# Patient Record
Sex: Male | Born: 1950
Health system: Southern US, Community
[De-identification: ages and names within clinical notes are randomized; demographics above are authoritative.]

## PROBLEM LIST (undated history)

## (undated) DIAGNOSIS — M1990 Unspecified osteoarthritis, unspecified site: Secondary | ICD-10-CM

## (undated) DIAGNOSIS — I10 Essential (primary) hypertension: Secondary | ICD-10-CM

## (undated) DIAGNOSIS — M549 Dorsalgia, unspecified: Secondary | ICD-10-CM

## (undated) DIAGNOSIS — G8929 Other chronic pain: Secondary | ICD-10-CM

## (undated) DIAGNOSIS — E7800 Pure hypercholesterolemia, unspecified: Secondary | ICD-10-CM

## (undated) DIAGNOSIS — Z85528 Personal history of other malignant neoplasm of kidney: Secondary | ICD-10-CM

## (undated) DIAGNOSIS — K219 Gastro-esophageal reflux disease without esophagitis: Secondary | ICD-10-CM

## (undated) DIAGNOSIS — I776 Arteritis, unspecified: Secondary | ICD-10-CM

## (undated) HISTORY — PX: COSMETIC SURGERY: SHX468

## (undated) HISTORY — PX: FRACTURE SURGERY: SHX138

## (undated) HISTORY — DX: Gastro-esophageal reflux disease without esophagitis: K21.9

## (undated) HISTORY — PX: SHOULDER SURGERY: SHX246

## (undated) HISTORY — PX: COLONOSCOPY: SHX174

## (undated) HISTORY — PX: VASECTOMY: SHX75

## (undated) HISTORY — DX: Dorsalgia, unspecified: M54.9

## (undated) HISTORY — DX: Arteritis, unspecified: I77.6

## (undated) HISTORY — DX: Personal history of other malignant neoplasm of kidney: Z85.528

## (undated) HISTORY — PX: ELBOW SURGERY: SHX618

## (undated) HISTORY — DX: Other chronic pain: G89.29

## (undated) HISTORY — DX: Unspecified osteoarthritis, unspecified site: M19.90

---

## 2008-10-31 ENCOUNTER — Ambulatory Visit: Payer: Self-pay | Admitting: Gastroenterology

## 2008-11-12 ENCOUNTER — Telehealth: Payer: Self-pay | Admitting: Internal Medicine

## 2008-11-15 ENCOUNTER — Ambulatory Visit: Payer: Self-pay | Admitting: Gastroenterology

## 2011-08-23 ENCOUNTER — Emergency Department (INDEPENDENT_AMBULATORY_CARE_PROVIDER_SITE_OTHER)
Admission: EM | Admit: 2011-08-23 | Discharge: 2011-08-23 | Disposition: A | Discharge status: Home / Self Care | Payer: Self-pay | Source: Home / Self Care | Attending: Family Medicine | Admitting: Family Medicine

## 2011-08-23 ENCOUNTER — Encounter (HOSPITAL_COMMUNITY): Payer: Self-pay

## 2011-08-23 DIAGNOSIS — L309 Dermatitis, unspecified: Secondary | ICD-10-CM

## 2011-08-23 DIAGNOSIS — L259 Unspecified contact dermatitis, unspecified cause: Secondary | ICD-10-CM

## 2011-08-23 HISTORY — DX: Essential (primary) hypertension: I10

## 2011-08-23 HISTORY — DX: Pure hypercholesterolemia, unspecified: E78.00

## 2011-08-23 MED ORDER — HYDROXYZINE HCL 25 MG PO TABS: 25.0000 mg | ORAL_TABLET | Freq: Four times a day (QID) | ORAL | Status: AC

## 2011-08-23 MED ORDER — PREDNISONE 5 MG PO TABS: ORAL_TABLET | ORAL | Status: AC

## 2011-08-23 MED ORDER — PREDNISONE 20 MG PO TABS: ORAL_TABLET | ORAL | Status: AC

## 2011-08-23 NOTE — ED Notes (Signed)
Pt has itchy rash on face, neck and trunk that started on Thursday, no new meds and states he had prednisone that he took from previous visit.

## 2011-08-23 NOTE — ED Provider Notes (Signed)
History     CSN: [id number]  Arrival date & time 08/23/11  0908   First MD Initiated Contact with Patient 08/23/11 [phone]      Chief Complaint  Patient presents with  . Rash    (Consider location/radiation/quality/duration/timing/severity/associated sxs/prior treatment) HPI Comments: Johnny Bryant presents for evaluation of an itchy rash that started on his face and neck and now has extended to his trunk and legs and arms. He denies any new exposures except for bar of soap a used Wednesday night. He also reports walking dogs outside near a shelter. He denies any new medications. He does take Aquanil lisinopril and pravastatin. He did start a prednisone Dosepak that he had been given for arthritis. He thinks the rash is worsening despite this. He denies any shortness of breath or swelling. Patient is a 61 y.o. male presenting with rash. The history is provided by the patient.  Rash  This is a new problem. The problem has been gradually worsening. The problem is associated with an unknown factor. There has been no fever. The rash is present on the scalp, head, face, neck, torso, back, abdomen, trunk, left upper leg and right upper leg. The patient is experiencing no pain. The pain has been constant since onset. Associated symptoms include itching. He has tried antihistamines for the symptoms.    Past Medical History  Diagnosis Date  . Hypertension   . Hypercholesteremia     Past Surgical History  Procedure Date  . Elbow surgery   . Shoulder surgery     History reviewed. No pertinent family history.  History  Substance Use Topics  . Smoking status: Never Smoker   . Smokeless tobacco: Not on file  . Alcohol Use: Yes      Review of Systems  Constitutional: Negative.   HENT: Negative.   Eyes: Negative.   Respiratory: Negative.   Cardiovascular: Negative.   Gastrointestinal: Negative.   Genitourinary: Negative.   Musculoskeletal: Negative.   Skin: Positive for itching and rash.    Neurological: Negative.     Allergies  Penicillins  Home Medications   Current Outpatient Rx  Name Route Sig Dispense Refill  . HYDROXYCHLOROQUINE SULFATE 200 MG PO TABS Oral Take by mouth daily.    Marland Kitchen LISINOPRIL 20 MG PO TABS Oral Take 20 mg by mouth daily.    Marland Kitchen PRAVASTATIN SODIUM 20 MG PO TABS Oral Take 20 mg by mouth daily.    Marland Kitchen HYDROXYZINE HCL 25 MG PO TABS Oral Take 1 tablet (25 mg total) by mouth every 6 (six) hours. 12 tablet 0  . PREDNISONE 20 MG PO TABS  Take three tablets daily on days 1 - 3, then two tablets daily on days 4 - 6. Then take 1 tablet daily on days 7 - 9. Then start next set of tablets. 18 tablet 0  . PREDNISONE 5 MG PO TABS  Take two tablets daily on days 10 - 12, then take one tablet on days 13 - 15 9 tablet 0    BP 143/74  Pulse 92  Temp(Src) 98.7 F (37.1 C) (Oral)  Resp 18  SpO2 97%  Physical Exam  Nursing note and vitals reviewed. Constitutional: He is oriented to person, place, and time. He appears well-developed and well-nourished.  HENT:  Head: Normocephalic and atraumatic.  Right Ear: Tympanic membrane normal.  Left Ear: Tympanic membrane normal.  Mouth/Throat: Uvula is midline, oropharynx is clear and moist and mucous membranes are normal.  Eyes: EOM are normal.  Neck:  Normal range of motion.  Pulmonary/Chest: Effort normal and breath sounds normal. He has no wheezes. He has no rhonchi.  Musculoskeletal: Normal range of motion.  Neurological: He is alert and oriented to person, place, and time.  Skin: Skin is warm and dry. Rash noted. Rash is macular. There is erythema.     Psychiatric: His behavior is normal.    ED Course  Procedures (including critical care time)  Labs Reviewed - No data to display No results found.   1. Dermatitis       MDM  Extended prednisone taper given; rx for hydroxyzine given; advised use of topical preparations for symptom relief; dermatology info given if symptoms worsen        Richardo Priest, MD 08/23/11 1108

## 2011-08-28 ENCOUNTER — Telehealth (HOSPITAL_COMMUNITY): Payer: Self-pay | Admitting: *Deleted

## 2011-08-28 ENCOUNTER — Emergency Department (INDEPENDENT_AMBULATORY_CARE_PROVIDER_SITE_OTHER)
Admission: EM | Admit: 2011-08-28 | Discharge: 2011-08-28 | Disposition: A | Discharge status: Home / Self Care | Payer: Self-pay | Source: Home / Self Care | Attending: Emergency Medicine | Admitting: Emergency Medicine

## 2011-08-28 ENCOUNTER — Encounter (HOSPITAL_COMMUNITY): Payer: Self-pay | Admitting: *Deleted

## 2011-08-28 DIAGNOSIS — I10 Essential (primary) hypertension: Secondary | ICD-10-CM

## 2011-08-28 DIAGNOSIS — L270 Generalized skin eruption due to drugs and medicaments taken internally: Secondary | ICD-10-CM

## 2011-08-28 DIAGNOSIS — T887XXA Unspecified adverse effect of drug or medicament, initial encounter: Secondary | ICD-10-CM

## 2011-08-28 DIAGNOSIS — T50904A Poisoning by unspecified drugs, medicaments and biological substances, undetermined, initial encounter: Secondary | ICD-10-CM

## 2011-08-28 MED ORDER — METHYLPREDNISOLONE ACETATE PF 80 MG/ML IJ SUSP
80.0000 mg | Freq: Once | INTRAMUSCULAR | Status: AC
  Administered 2011-08-28: 80 mg via INTRAMUSCULAR

## 2011-08-28 MED ORDER — METHYLPREDNISOLONE ACETATE 80 MG/ML IJ SUSP
INTRAMUSCULAR | Status: AC
  Filled 2011-08-28: qty 1

## 2011-08-28 MED ORDER — NYSTATIN 100000 UNIT/ML MT SUSP: 500000.0000 [IU] | Freq: Four times a day (QID) | OROMUCOSAL | Status: AC

## 2011-08-28 MED ORDER — DOXEPIN HCL 10 MG PO CAPS: 10.0000 mg | ORAL_CAPSULE | Freq: Three times a day (TID) | ORAL | Status: DC

## 2011-08-28 NOTE — ED Notes (Signed)
Johnny Bryant  Reports       Rash    With  Burning  Sensation   X  1  week  [facility]  [facility]     And  Was  Given rx  For  Prednisone   Which  He  Is  Almost  Finished     He  denys  Any new  meds  Or       Known  Causative  Agents

## 2011-08-28 NOTE — ED Provider Notes (Signed)
Chief Complaint  Patient presents with  . Rash    History of Present Illness:   Johnny Bryant has a ten-day history of a pruritic, painful rash which began on his neck and spread down his chest, upper back, abdomen, and then to his arms and legs. The rash on the neck and upper chest seems to be getting better, but the rash on the arms and legs getting worse. He came here about a week ago. He was given prednisone and he thinks this has helped somewhat. The rash itches and burns. The only new medication is taking his hydroxychloroquine which he is taking for rheumatoid arthritis. He denies any fever but he's had chills and sweats. He's had a somewhat scratchy throat and his voice has been hoarse. He has had no suspicious exposures, contactants, other or new medications or ingestions, or use of new soaps, washing powders, fabric softeners, or dryer sheets.  Review of Systems:  Other than noted above, the patient denies any of the following symptoms: Systemic:  No fever, chills, sweats, weight loss, or fatigue. ENT:  No nasal congestion, rhinorrhea, sore throat, swelling of lips, tongue or throat. Resp:  No cough, wheezing, or shortness of breath. Skin:  No rash, itching, nodules, or suspicious lesions.  PMFSH:  Past medical history, family history, social history, meds, and allergies were reviewed.  Physical Exam:   Vital signs:  BP 159/77  Pulse 104  Temp(Src) 98.4 F (36.9 C) (Oral)  Resp 18  SpO2 100% Gen:  Alert, oriented, in no distress. Skin:  He has an erythematous rash on his trunk, arms, and legs. The rash on the neck is almost gone away completely in the rash the trunk is fading, but the rash in the arms and legs is worse. This is not peeling. It's erythematous and blanches. There is no purpura or petechiae iris or bull's-eye lesions. Her. The rash is very faint and lacey. ENT: He has white patches on his buccal mucosa, soft palate, and pharynx consistent with a Candida infection.  Course in  [facility]gent [facility]:   He was given Depo-Medrol 80 mg IM and tolerated this medication well without any immediate side effects.   Assessment:   Diagnoses that have been ruled out:  None  Diagnoses that are still under consideration:  None  Final diagnoses:  Allergic drug rash - most likely due to allergy, possibly to hydroxychloroquine. Alternatively, this may be due to to a viral illness.     Plan:   1.  The following meds were prescribed:   New Prescriptions   DOXEPIN (SINEQUAN) 10 MG CAPSULE    Take 1 capsule (10 mg total) by mouth 3 (three) times daily.   NYSTATIN (MYCOSTATIN) 100000 UNIT/ML SUSPENSION    Take 5 mLs (500,000 Units total) by mouth 4 (four) times daily.   2.  The patient was instructed in symptomatic care and handouts were given. 3.  The patient was told to return if becoming worse in any way, if no better in 3 or 4 days, and given some red flag symptoms that would indicate earlier return. 4.  The patient was told to discontinue his hydroxychloroquine.  Once the rash is gone away, he can try rechallenging himself, just to make sure that's what is.     Roque Lias, MD 08/28/11 [phone]

## 2011-08-28 NOTE — ED Notes (Signed)
1343 Pt. called and said the doctor did not give him the steroid shot last time because he was driving. He is taking the pills but the hives have continued. Denies SOB. Does not know what he is allergic to. No PCP or insurance. He asked if he gets a driver can he get the shot. I told him I don't know what the doctor will prescribe but should be rechecked if not getting better. He asked if it would be better to come later. I told him it would be better to come now because it gets busier after 1700. Vassie Moselle 08/28/2011

## 2011-08-28 NOTE — Discharge Instructions (Signed)
Stop hydroxycholoroquine.

## 2011-09-03 ENCOUNTER — Telehealth (HOSPITAL_COMMUNITY): Payer: Self-pay | Admitting: *Deleted

## 2011-09-03 NOTE — ED Notes (Signed)
Pt. called on VM and said he was treated 2 times for his rash and symptoms are coming back. Please call today-going out of town. I called pt. back and he said he started to get itching and rash on his legs again yesterday and is leaving for the beach. States he will be gone for a week. I told him the doctor is not going to prescribe steroids without seeing him again. I told him he could go to an [facility] at the beach.  He said he can't afford to keep coming in for this. He has no insurance or PCP. I told he has not choice but to be seen again and try to figure out what is causing the rash. Stop medicine and start back on at a time to see if he can figure out if that is what he is allergic to. Vassie Moselle 09/03/2011

## 2012-10-07 ENCOUNTER — Emergency Department (INDEPENDENT_AMBULATORY_CARE_PROVIDER_SITE_OTHER)
Admission: EM | Admit: 2012-10-07 | Discharge: 2012-10-07 | Disposition: A | Discharge status: Home / Self Care | Payer: Self-pay | Source: Home / Self Care | Attending: Emergency Medicine | Admitting: Emergency Medicine

## 2012-10-07 ENCOUNTER — Emergency Department (INDEPENDENT_AMBULATORY_CARE_PROVIDER_SITE_OTHER): Payer: Self-pay

## 2012-10-07 ENCOUNTER — Encounter (HOSPITAL_COMMUNITY): Payer: Self-pay | Admitting: *Deleted

## 2012-10-07 DIAGNOSIS — S2232XA Fracture of one rib, left side, initial encounter for closed fracture: Secondary | ICD-10-CM

## 2012-10-07 DIAGNOSIS — L719 Rosacea, unspecified: Secondary | ICD-10-CM

## 2012-10-07 DIAGNOSIS — S2239XA Fracture of one rib, unspecified side, initial encounter for closed fracture: Secondary | ICD-10-CM

## 2012-10-07 MED ORDER — ACETAMINOPHEN-CODEINE #3 300-30 MG PO TABS: 1.0000 | ORAL_TABLET | ORAL | Status: DC | PRN

## 2012-10-07 MED ORDER — METRONIDAZOLE 1 % EX GEL: Freq: Every day | CUTANEOUS | Status: DC

## 2012-10-07 MED ORDER — DOXYCYCLINE HYCLATE 100 MG PO TABS: 100.0000 mg | ORAL_TABLET | Freq: Two times a day (BID) | ORAL | Status: DC

## 2012-10-07 NOTE — ED Provider Notes (Signed)
Chief Complaint:   Chief Complaint  Patient presents with  . Rash    rash      History of Present Illness:   Johnny Bryant is a 62 year old male with a history of high blood pressure and hyperlipidemia who presents today with a facial rash and chest pain.  1. Facial rash: This appears about 3 weeks ago. He attributed to some soma that he was taking and stop the soma is been off of this for about 2-1/2 weeks but the rash has persisted. It's mildly pruritic he took some prednisone the rash went away completely while he was taking it but then recurred again. He recalls a similar rash although less severe off and on for years.  2. Chest pain: The patient fell on ice 6 weeks ago, landing on his left chest. Ever since then he's had left submammary pain with deep inspiration, twisting, and bending he attributes this pain to AP and in the shoulder that was placed about 4 years ago. He thinks he was told by another doctor that this might have migrated to around his heart and might be causing him that chest pain. He denies any shortness of breath, cough, or hemoptysis.  Review of Systems:  Other than noted above, the patient denies any of the following symptoms. Systemic:  No fever, chills, sweats, fatigue, myalgias, headache, or anorexia. Eye:  No redness, pain or drainage. ENT:  No earache, nasal congestion, rhinorrhea, sinus pressure, or sore throat. Lungs:  No cough, sputum production, wheezing, shortness of breath.  Cardiovascular:  No chest pain, palpitations, or syncope. GI:  No nausea, vomiting, abdominal pain or diarrhea. GU:  No dysuria, frequency, or hematuria. Skin:  No rash or pruritis.  PMFSH:  Past medical history, family history, social history, meds, and allergies were reviewed.  He is allergic to penicillin. He has high blood pressure and hyperlipidemia. He takes Vicodin and amitriptyline as well as a blood pressure and cholesterol medicine. He's been troubled with chronic  indigestion.  Physical Exam:   Vital signs:  BP 200/111  Pulse 105  Temp(Src) 99.1 F (37.3 C) (Oral)  Resp 22  SpO2 100% General:  Alert, in no distress. Eye:  PERRL, full EOMs.  Lids and conjunctivas were normal. ENT:  TMs and canals were normal, without erythema or inflammation.  Nasal mucosa was clear and uncongested, without drainage.  Mucous membranes were moist.  Pharynx was clear, without exudate or drainage.  There were no oral ulcerations or lesions. Neck:  Supple, no adenopathy, tenderness or mass. Thyroid was normal. Lungs:  No respiratory distress.  Lungs were clear to auscultation, without wheezes, rales or rhonchi.  Breath sounds were clear and equal bilaterally. Chest: He has localized tenderness to palpation in the left submammary area without any swelling, bruising, or deformity. Heart:  Regular rhythm, without gallops, murmers or rubs. Abdomen:  Soft, flat, and non-tender to palpation.  No hepatosplenomagaly or mass. Skin:  Clear, warm, and dry, he has erythema of his face involving cheeks, nasal labial folds, and forehead, skin was otherwise clear.  Radiology:  Dg Ribs Unilateral W/chest Left  10/07/2012  *RADIOLOGY REPORT*  Clinical Data: Left sub mammary pain, fell 6 weeks ago  LEFT RIBS AND CHEST - 3+ VIEW  Comparison: None  Findings: Normal heart size, mediastinal contours, and pulmonary vascularity. Lungs clear. No pleural effusion or pneumothorax. Metallic foreign body projects over left axilla. Bones appear slightly demineralized. Healing fractures of the anterior left fifth and sixth ribs identified.  IMPRESSION: Healing fractures anterior left fifth and sixth ribs.   Original Report Authenticated By: Ulyses Southward, M.D.       Assessment:  The primary encounter diagnosis was Rosacea. A diagnosis of Rib fractures, left, closed, initial encounter was also pertinent to this visit.  The facial rash appears to be rosacea. I do not think it's an allergic rash and don't  think a headache into the soma, I think is just coincidence that appears about the same time he started taking soma.  The chest pain appears to be due to fractures of the fifth and sixth ribs anteriorly. He does have pain in the region of the axilla, but there is no evidence that this is causing his chest pain.  Plan:   1.  The following meds were prescribed:   Discharge Medication List as of 10/07/2012  5:49 PM    START taking these medications   Details  acetaminophen-codeine (TYLENOL #3) 300-30 MG per tablet Take 1-2 tablets by mouth every 4 (four) hours as needed for pain., Starting 10/07/2012, Until Discontinued, Print    doxycycline (VIBRA-TABS) 100 MG tablet Take 1 tablet (100 mg total) by mouth 2 (two) times daily., Starting 10/07/2012, Until Discontinued, Normal    metroNIDAZOLE (METROGEL) 1 % gel Apply topically daily., Starting 10/07/2012, Until Discontinued, Normal       2.  The patient was instructed in symptomatic care and handouts were given. 3.  The patient was told to return if becoming worse in any way, if no better in 3 or 4 days, and given some red flag symptoms such as fever, worsening rash, or difficulty breathing that would indicate earlier return.    Reuben Likes, MD 10/07/12 2005

## 2012-10-07 NOTE — ED Notes (Signed)
Pt  Reports  Symptoms   Of  Rash         X  1  Week   After  Taking  Soma       He  Stopped  The  Soma       Because  Of the  Rash but it  Still  Is  There         He  Also  Stopped  His  Lisinopril       And  His  bp is  High      -  He  Also  Reports  Pain l  Side  Of  Chest  Worse  When he  Takes a  Deep  Breath     He  Attributes  This  To a  Fall he  Had  A  Few  Weeks  Ago  And  Previous   Surgery

## 2013-08-08 ENCOUNTER — Ambulatory Visit (INDEPENDENT_AMBULATORY_CARE_PROVIDER_SITE_OTHER): Payer: No Typology Code available for payment source | Admitting: Internal Medicine

## 2013-08-08 VITALS — BP 152/80 | HR 83 | Temp 98.9°F | Resp 18 | Ht 68.0 in | Wt 177.0 lb

## 2013-08-08 DIAGNOSIS — M25529 Pain in unspecified elbow: Secondary | ICD-10-CM

## 2013-08-08 DIAGNOSIS — D649 Anemia, unspecified: Secondary | ICD-10-CM

## 2013-08-08 DIAGNOSIS — K921 Melena: Secondary | ICD-10-CM

## 2013-08-08 DIAGNOSIS — N390 Urinary tract infection, site not specified: Secondary | ICD-10-CM

## 2013-08-08 LAB — COMPREHENSIVE METABOLIC PANEL
ALK PHOS: 74 U/L (ref 39–117)
ALT: 12 U/L (ref 0–53)
AST: 18 U/L (ref 0–37)
Albumin: 4.5 g/dL (ref 3.5–5.2)
BILIRUBIN TOTAL: 0.5 mg/dL (ref 0.2–1.2)
BUN: 12 mg/dL (ref 6–23)
CO2: 27 mEq/L (ref 19–32)
Calcium: 9.8 mg/dL (ref 8.4–10.5)
Chloride: 101 mEq/L (ref 96–112)
Creat: 0.7 mg/dL (ref 0.50–1.35)
GLUCOSE: 121 mg/dL — AB (ref 70–99)
Potassium: 4.2 mEq/L (ref 3.5–5.3)
SODIUM: 138 meq/L (ref 135–145)
TOTAL PROTEIN: 7.3 g/dL (ref 6.0–8.3)

## 2013-08-08 LAB — POCT CBC
Granulocyte percent: 69 %G (ref 37–80)
HCT, POC: 41.3 % — AB (ref 43.5–53.7)
Hemoglobin: 13.4 g/dL — AB (ref 14.1–18.1)
LYMPH, POC: 1.7 (ref 0.6–3.4)
MCH, POC: 30.2 pg (ref 27–31.2)
MCHC: 32.4 g/dL (ref 31.8–35.4)
MCV: 93.1 fL (ref 80–97)
MID (CBC): 0.5 (ref 0–0.9)
MPV: 9.3 fL (ref 0–99.8)
PLATELET COUNT, POC: 279 10*3/uL (ref 142–424)
POC GRANULOCYTE: 5 (ref 2–6.9)
POC LYMPH %: 23.5 % (ref 10–50)
POC MID %: 7.5 % (ref 0–12)
RBC: 4.44 M/uL — AB (ref 4.69–6.13)
RDW, POC: 13.6 %
WBC: 7.3 10*3/uL (ref 4.6–10.2)

## 2013-08-08 LAB — POCT UA - MICROSCOPIC ONLY
CASTS, UR, LPF, POC: NEGATIVE
CRYSTALS, UR, HPF, POC: NEGATIVE
Mucus, UA: NEGATIVE
Yeast, UA: NEGATIVE

## 2013-08-08 LAB — POCT URINALYSIS DIPSTICK
BILIRUBIN UA: NEGATIVE
Glucose, UA: NEGATIVE
Ketones, UA: NEGATIVE
Leukocytes, UA: NEGATIVE
NITRITE UA: NEGATIVE
PH UA: 7
Spec Grav, UA: 1.02
UROBILINOGEN UA: 0.2

## 2013-08-08 LAB — IFOBT (OCCULT BLOOD): IMMUNOLOGICAL FECAL OCCULT BLOOD TEST: POSITIVE

## 2013-08-08 MED ORDER — HYDROCODONE-ACETAMINOPHEN 5-325 MG PO TABS: 1.0000 | ORAL_TABLET | Freq: Four times a day (QID) | ORAL | Status: DC | PRN

## 2013-08-08 MED ORDER — CEFTRIAXONE SODIUM 1 G IJ SOLR
1.0000 g | INTRAMUSCULAR | Status: DC
  Administered 2013-08-08: 1 g via INTRAMUSCULAR

## 2013-08-08 MED ORDER — CIPROFLOXACIN HCL 500 MG PO TABS: 500.0000 mg | ORAL_TABLET | Freq: Two times a day (BID) | ORAL | Status: DC

## 2013-08-08 NOTE — Progress Notes (Signed)
Subjective:    Patient ID: Johnny Bryant, male    DOB: [birth year], 63 y.o.   MRN: [id number]  HPI  63 year old male presents to [facility] for joint pain  Recurring joint pain, presently pain in left shoulder, right elbow, left wrist, Pain typically spreads from left wrist, left shoulder, right shoulder, right elbow, right wrist, left knee Swells, pain worsens until treated with prednisone.  Saw an orthopedic specialist last month and was told he has tendonitis, given prednisone and codeine. Orthopedic specialist stated he needs to see a rheumatologist or another orthopedic locally Just got insurance and needs a primary care physician  Needs referral to Dr Delice Bison, appointment is in March .  Review of Systems Needs cpe soon    Objective:   Physical Exam  Vitals reviewed. Constitutional: He is oriented to person, place, and time. He appears well-developed and well-nourished.  HENT:  Head: Normocephalic.  Mouth/Throat: Oropharynx is clear and moist.  Eyes: EOM are normal. Pupils are equal, round, and reactive to light. No scleral icterus.  Neck: Normal range of motion. Neck supple. No thyromegaly present.  Cardiovascular: Normal rate, regular rhythm and normal heart sounds.   Pulmonary/Chest: Effort normal and breath sounds normal.  Abdominal: Soft.  Genitourinary: Prostate normal. Guaiac positive stool. No penile tenderness.  Musculoskeletal: He exhibits tenderness. He exhibits no edema.       Left shoulder: He exhibits decreased range of motion, tenderness, bony tenderness and pain. He exhibits no swelling, no crepitus and normal strength.       Right elbow: He exhibits normal range of motion, no swelling, no effusion and no deformity. Tenderness found. Radial head and medial epicondyle tenderness noted. No lateral epicondyle and no olecranon process tenderness noted.       Left wrist: He exhibits tenderness and bony tenderness. He exhibits normal range of  motion, no swelling, no effusion, no crepitus and no deformity.       Left knee: He exhibits bony tenderness and abnormal meniscus. He exhibits normal range of motion, no swelling, no effusion, no ecchymosis, no deformity and no erythema. Tenderness found. Medial joint line tenderness noted.  No swelling, reddness, or warmth to any joints  Lymphadenopathy:    He has no cervical adenopathy.  Neurological: He is alert and oriented to person, place, and time. He exhibits normal muscle tone. Coordination normal.  Skin: Skin is dry and intact. No rash noted.  Psychiatric: He has a normal mood and affect. His behavior is normal.     Results for orders placed in visit on 08/08/13  POCT CBC      Result Value Range   WBC 7.3  4.6 - 10.2 K/uL   Lymph, poc 1.7  0.6 - 3.4   POC LYMPH PERCENT 23.5  10 - 50 %L   MID (cbc) 0.5  0 - 0.9   POC MID % 7.5  0 - 12 %M   POC Granulocyte 5.0  2 - 6.9   Granulocyte percent 69.0  37 - 80 %G   RBC 4.44 (*) 4.69 - 6.13 M/uL   Hemoglobin 13.4 (*) 14.1 - 18.1 g/dL   HCT, POC 41.3 (*) 43.5 - 53.7 %   MCV 93.1  80 - 97 fL   MCH, POC 30.2  27 - 31.2 pg   MCHC 32.4  31.8 - 35.4 g/dL   RDW, POC 13.6     Platelet Count, POC 279  142 - 424 K/uL  MPV 9.3  0 - 99.8 fL  IFOBT (OCCULT BLOOD)      Result Value Range   IFOBT Positive    POCT URINALYSIS DIPSTICK      Result Value Range   Color, UA yellow     Clarity, UA clear     Glucose, UA neg     Bilirubin, UA neg     Ketones, UA neg     Spec Grav, UA 1.020     Blood, UA moderate     pH, UA 7.0     Protein, UA trace     Urobilinogen, UA 0.2     Nitrite, UA neg     Leukocytes, UA Negative    POCT UA - MICROSCOPIC ONLY      Result Value Range   WBC, Ur, HPF, POC 0-6     RBC, urine, microscopic 10-17     Bacteria, U Microscopic 2+     Mucus, UA neg     Epithelial cells, urine per micros 0-1     Crystals, Ur, HPF, POC neg     Casts, Ur, LPF, POC neg     Yeast, UA neg     Culture urine       Assessment & Plan:  Joint pains/Arthritis/Elevated CRP Hematuria/Bactiuria--CS urine Anemia improved/Schedule colonoscopy/Blood in stool

## 2013-08-08 NOTE — Patient Instructions (Addendum)
Arthritis, Nonspecific Arthritis is inflammation of a joint. This usually means pain, redness, warmth or swelling are present. One or more joints may be involved. There are a number of types of arthritis. Your caregiver may not be able to tell what type of arthritis you have right away. CAUSES  The most common cause of arthritis is the wear and tear on the joint (osteoarthritis). This causes damage to the cartilage, which can break down over time. The knees, hips, back and neck are most often affected by this type of arthritis. Other types of arthritis and common causes of joint pain include:  Sprains and other injuries near the joint. Sometimes minor sprains and injuries cause pain and swelling that develop hours later.  Rheumatoid arthritis. This affects hands, feet and knees. It usually affects both sides of your body at the same time. It is often associated with chronic ailments, fever, weight loss and general weakness.  Crystal arthritis. Gout and pseudo gout can cause occasional acute severe pain, redness and swelling in the foot, ankle, or knee.  Infectious arthritis. Bacteria can get into a joint through a break in overlying skin. This can cause infection of the joint. Bacteria and viruses can also spread through the blood and affect your joints.  Drug, infectious and allergy reactions. Sometimes joints can become mildly painful and slightly swollen with these types of illnesses. SYMPTOMS   Pain is the main symptom.  Your joint or joints can also be red, swollen and warm or hot to the touch.  You may have a fever with certain types of arthritis, or even feel overall ill.  The joint with arthritis will hurt with movement. Stiffness is present with some types of arthritis. DIAGNOSIS  Your caregiver will suspect arthritis based on your description of your symptoms and on your exam. Testing may be needed to find the type of arthritis:  Blood and sometimes urine tests.  X-ray tests  and sometimes CT or MRI scans.  Removal of fluid from the joint (arthrocentesis) is done to check for bacteria, crystals or other causes. Your caregiver (or a specialist) will numb the area over the joint with a local anesthetic, and use a needle to remove joint fluid for examination. This procedure is only minimally uncomfortable.  Even with these tests, your caregiver may not be able to tell what kind of arthritis you have. Consultation with a specialist (rheumatologist) may be helpful. TREATMENT  Your caregiver will discuss with you treatment specific to your type of arthritis. If the specific type cannot be determined, then the following general recommendations may apply. Treatment of severe joint pain includes:  Rest.  Elevation.  Anti-inflammatory medication (for example, ibuprofen) may be prescribed. Avoiding activities that cause increased pain.  Only take over-the-counter or prescription medicines for pain and discomfort as recommended by your caregiver.  Cold packs over an inflamed joint may be used for 10 to 15 minutes every hour. Hot packs sometimes feel better, but do not use overnight. Do not use hot packs if you are diabetic without your caregiver's permission.  A cortisone shot into arthritic joints may help reduce pain and swelling.  Any acute arthritis that gets worse over the next 1 to 2 days needs to be looked at to be sure there is no joint infection. Long-term arthritis treatment involves modifying activities and lifestyle to reduce joint stress jarring. This can include weight loss. Also, exercise is needed to nourish the joint cartilage and remove waste. This helps keep the muscles   around the joint strong. HOME CARE INSTRUCTIONS   Do not take aspirin to relieve pain if gout is suspected. This elevates uric acid levels.  Only take over-the-counter or prescription medicines for pain, discomfort or fever as directed by your caregiver.  Rest the joint as much as  possible.  If your joint is swollen, keep it elevated.  Use crutches if the painful joint is in your leg.  Drinking plenty of fluids may help for certain types of arthritis.  Follow your caregiver's dietary instructions.  Try low-impact exercise such as:  Swimming.  Water aerobics.  Biking.  Walking.  Morning stiffness is often relieved by a warm shower.  Put your joints through regular range-of-motion. SEEK MEDICAL CARE IF:   You do not feel better in 24 hours or are getting worse.  You have side effects to medications, or are not getting better with treatment. SEEK IMMEDIATE MEDICAL CARE IF:   You have a fever.  You develop severe joint pain, swelling or redness.  Many joints are involved and become painful and swollen.  There is severe back pain and/or leg weakness.  You have loss of bowel or bladder control. Document Released: 07/30/2004 Document Revised: 09/14/2011 Document Reviewed: 08/15/2008 [facility] Patient Information 2014 [place]. Anemia, Nonspecific Anemia is a condition in which the concentration of red blood cells or hemoglobin in the blood is below normal. Hemoglobin is a substance in red blood cells that carries oxygen to the tissues of the body. Anemia results in not enough oxygen reaching these tissues.  CAUSES  Common causes of anemia include:   Excessive bleeding. Bleeding may be internal or external. This includes excessive bleeding from periods (in women) or from the intestine.   Poor nutrition.   Chronic kidney, thyroid, and liver disease.  Bone marrow disorders that decrease red blood cell production.  Cancer and treatments for cancer.  HIV, AIDS, and their treatments.  Spleen problems that increase red blood cell destruction.  Blood disorders.  Excess destruction of red blood cells due to infection, medicines, and autoimmune disorders. SIGNS AND SYMPTOMS   Minor weakness.   Dizziness.    Headache.  Palpitations.   Shortness of breath, especially with exercise.   Paleness.  Cold sensitivity.  Indigestion.  Nausea.  Difficulty sleeping.  Difficulty concentrating. Symptoms may occur suddenly or they may develop slowly.  DIAGNOSIS  Additional blood tests are often needed. These help your health care provider determine the best treatment. Your health care provider will check your stool for blood and look for other causes of blood loss.  TREATMENT  Treatment varies depending on the cause of the anemia. Treatment can include:   Supplements of iron, vitamin G18, or folic acid.   Hormone medicines.   A blood transfusion. This may be needed if blood loss is severe.   Hospitalization. This may be needed if there is significant continual blood loss.   Dietary changes.  Spleen removal. HOME CARE INSTRUCTIONS Keep all follow-up appointments. It often takes many weeks to correct anemia, and having your health care provider check on your condition and your response to treatment is very important. SEEK IMMEDIATE MEDICAL CARE IF:   You develop extreme weakness, shortness of breath, or chest pain.   You become dizzy or have trouble concentrating.  You develop heavy vaginal bleeding.   You develop a rash.   You have bloody or black, tarry stools.   You faint.   You vomit up blood.   You vomit repeatedly.  You have abdominal pain.  You have a fever or persistent symptoms for more than 2 3 days.   You have a fever and your symptoms suddenly get worse.   You are dehydrated.  MAKE SURE YOU:  Understand these instructions.  Will watch your condition.  Will get help right away if you are not doing well or get worse. Document Released: 07/30/2004 Document Revised: 02/22/2013 Document Reviewed: 12/16/2012 [facility] Patient Information 2014 [place]. Gastrointestinal Bleeding Gastrointestinal (GI) bleeding means there is bleeding  somewhere along the digestive tract, between the mouth and anus. CAUSES  There are many different problems that can cause GI bleeding. Possible causes include:  Esophagitis. This is inflammation, irritation, or swelling of the esophagus.  Hemorrhoids.These are veins that are full of blood (engorged) in the rectum. They cause pain, inflammation, and may bleed.  Anal fissures.These are areas of painful tearing which may bleed. They are often caused by passing hard stool.  Diverticulosis.These are pouches that form on the colon over time, with age, and may bleed significantly.  Diverticulitis.This is inflammation in areas with diverticulosis. It can cause pain, fever, and bloody stools, although bleeding is rare.  Polyps and cancer. Colon cancer often starts out as precancerous polyps.  Gastritis and ulcers.Bleeding from the upper gastrointestinal tract (near the stomach) may travel through the intestines and produce black, sometimes tarry, often bad smelling stools. In certain cases, if the bleeding is fast enough, the stools may not be black, but red. This condition may be life-threatening. SYMPTOMS   Vomiting bright red blood or material that looks like coffee grounds.  Bloody, black, or tarry stools. DIAGNOSIS  Your caregiver may diagnose your condition by taking your history and performing a physical exam. More tests may be needed, including:  X-rays and other imaging tests.  Esophagogastroduodenoscopy (EGD). This test uses a flexible, lighted tube to look at your esophagus, stomach, and small intestine.  Colonoscopy. This test uses a flexible, lighted tube to look at your colon. TREATMENT  Treatment depends on the cause of your bleeding.   For bleeding from the esophagus, stomach, small intestine, or colon, the caregiver doing your EGD or colonoscopy may be able to stop the bleeding as part of the procedure.  Inflammation or infection of the colon can be treated with  medicines.  Many rectal problems can be treated with creams, suppositories, or warm baths.  Surgery is sometimes needed.  Blood transfusions are sometimes needed if you have lost a lot of blood. If bleeding is slow, you may be allowed to go home. If there is a lot of bleeding, you will need to stay in the hospital for observation. HOME CARE INSTRUCTIONS   Take any medicines exactly as prescribed.  Keep your stools soft by eating foods that are high in fiber. These foods include whole grains, legumes, fruits, and vegetables. Prunes (1 to 3 a day) work well for many people.  Drink enough fluids to keep your urine clear or pale yellow. SEEK IMMEDIATE MEDICAL CARE IF:   Your bleeding increases.  You feel lightheaded, weak, or you faint.  You have severe cramps in your back or abdomen.  You pass large blood clots in your stool.  Your problems are getting worse. MAKE SURE YOU:   Understand these instructions.  Will watch your condition.  Will get help right away if you are not doing well or get worse. Document Released: 06/19/2000 Document Revised: 06/08/2012 Document Reviewed: 06/01/2011 [facility] Patient Information 2014 [place], [place].

## 2013-08-09 LAB — URINE CULTURE
COLONY COUNT: NO GROWTH
ORGANISM ID, BACTERIA: NO GROWTH

## 2013-08-09 LAB — TSH: TSH: 0.715 u[IU]/mL (ref 0.350–4.500)

## 2013-08-09 LAB — PSA: PSA: 0.71 ng/mL (ref <=4.00)

## 2013-08-15 ENCOUNTER — Encounter: Payer: Self-pay | Admitting: Gastroenterology

## 2013-08-17 ENCOUNTER — Telehealth: Payer: Self-pay

## 2013-08-17 NOTE — Telephone Encounter (Signed)
Requesting Hydrocodone-Acetaminophen (VICODIN PO) He has cut back on advil.   He says he is apparently bleeding internally as he has blood in his stool.   [phone]

## 2013-08-18 ENCOUNTER — Ambulatory Visit (INDEPENDENT_AMBULATORY_CARE_PROVIDER_SITE_OTHER): Payer: No Typology Code available for payment source | Admitting: Family Medicine

## 2013-08-18 ENCOUNTER — Telehealth: Payer: Self-pay

## 2013-08-18 VITALS — BP 134/78 | HR 107 | Temp 100.3°F | Resp 18 | Ht 70.0 in | Wt 178.0 lb

## 2013-08-18 DIAGNOSIS — R319 Hematuria, unspecified: Secondary | ICD-10-CM | POA: Insufficient documentation

## 2013-08-18 DIAGNOSIS — G8929 Other chronic pain: Secondary | ICD-10-CM

## 2013-08-18 DIAGNOSIS — M25529 Pain in unspecified elbow: Secondary | ICD-10-CM

## 2013-08-18 DIAGNOSIS — M25519 Pain in unspecified shoulder: Secondary | ICD-10-CM

## 2013-08-18 DIAGNOSIS — G894 Chronic pain syndrome: Secondary | ICD-10-CM

## 2013-08-18 DIAGNOSIS — M2550 Pain in unspecified joint: Secondary | ICD-10-CM

## 2013-08-18 DIAGNOSIS — R3129 Other microscopic hematuria: Secondary | ICD-10-CM

## 2013-08-18 DIAGNOSIS — R1013 Epigastric pain: Secondary | ICD-10-CM

## 2013-08-18 DIAGNOSIS — R195 Other fecal abnormalities: Secondary | ICD-10-CM | POA: Insufficient documentation

## 2013-08-18 DIAGNOSIS — D649 Anemia, unspecified: Secondary | ICD-10-CM

## 2013-08-18 LAB — BASIC METABOLIC PANEL
BUN: 15 mg/dL (ref 6–23)
CALCIUM: 9.5 mg/dL (ref 8.4–10.5)
CO2: 27 meq/L (ref 19–32)
CREATININE: 0.8 mg/dL (ref 0.50–1.35)
Chloride: 102 mEq/L (ref 96–112)
Glucose, Bld: 91 mg/dL (ref 70–99)
Potassium: 4.3 mEq/L (ref 3.5–5.3)
Sodium: 138 mEq/L (ref 135–145)

## 2013-08-18 LAB — POCT CBC
Granulocyte percent: 67.7 %G (ref 37–80)
HEMATOCRIT: 45.3 % (ref 43.5–53.7)
Hemoglobin: 14.6 g/dL (ref 14.1–18.1)
LYMPH, POC: 2.2 (ref 0.6–3.4)
MCH: 30.1 pg (ref 27–31.2)
MCHC: 32.2 g/dL (ref 31.8–35.4)
MCV: 93.3 fL (ref 80–97)
MID (cbc): 0.7 (ref 0–0.9)
MPV: 9.3 fL (ref 0–99.8)
POC Granulocyte: 6 (ref 2–6.9)
POC LYMPH %: 24.6 % (ref 10–50)
POC MID %: 7.7 %M (ref 0–12)
Platelet Count, POC: 335 10*3/uL (ref 142–424)
RBC: 4.85 M/uL (ref 4.69–6.13)
RDW, POC: 13.9 %
WBC: 8.8 10*3/uL (ref 4.6–10.2)

## 2013-08-18 LAB — POCT URINALYSIS DIPSTICK
Bilirubin, UA: NEGATIVE
Glucose, UA: NEGATIVE
LEUKOCYTES UA: NEGATIVE
NITRITE UA: NEGATIVE
PH UA: 5
PROTEIN UA: 30
Spec Grav, UA: >=1.03
Urobilinogen, UA: 0.2

## 2013-08-18 LAB — POCT UA - MICROSCOPIC ONLY
Bacteria, U Microscopic: NEGATIVE
CRYSTALS, UR, HPF, POC: NEGATIVE
Casts, Ur, LPF, POC: NEGATIVE
YEAST UA: NEGATIVE

## 2013-08-18 MED ORDER — CARISOPRODOL 350 MG PO TABS: 350.0000 mg | ORAL_TABLET | Freq: Three times a day (TID) | ORAL | Status: DC | PRN

## 2013-08-18 MED ORDER — HYDROCODONE-ACETAMINOPHEN 5-325 MG PO TABS: 1.0000 | ORAL_TABLET | Freq: Four times a day (QID) | ORAL | Status: DC | PRN

## 2013-08-18 MED ORDER — OMEPRAZOLE 20 MG PO CPDR: 20.0000 mg | DELAYED_RELEASE_CAPSULE | Freq: Every day | ORAL | Status: DC

## 2013-08-18 MED ORDER — CARISOPRODOL 250 MG PO TABS: 250.0000 mg | ORAL_TABLET | Freq: Three times a day (TID) | ORAL | Status: DC | PRN

## 2013-08-18 NOTE — Telephone Encounter (Signed)
Patient calling again this morning to say that may dr Carlota Raspberry could write his rx for pain meds since dr guest is not here, he says that dr Carlota Raspberry is going to be his regular dr

## 2013-08-18 NOTE — Patient Instructions (Addendum)
Depending on your kidney and psa results, we will discuss referral to either a nephrologist (kidney specialist) or urologist, and possible resumption of Cipro.  Avoid advil if at all possible.  Can take hydrocodone for now as discussed, and soma if needed, but would try to avoid combining these as may both cause sedation.  If any measured fever this weekend - return for recheck.  Follow up with Dr. Carlota Raspberry - next Wednesday after 2pm.  If any dark/tarry stools, or coffee grounds in vomit - go straight to the emergency room.  Return to the clinic or go to the nearest emergency room if any of your symptoms worsen or new symptoms occur.

## 2013-08-18 NOTE — Telephone Encounter (Signed)
Dr Carlota Raspberry can not write narcotics for him without a visit. Called patient to advise.
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
## 2013-08-19 LAB — PSA: PSA: 0.5 ng/mL (ref <=4.00)

## 2013-08-23 ENCOUNTER — Telehealth: Payer: Self-pay | Admitting: Gastroenterology

## 2013-08-23 NOTE — Telephone Encounter (Signed)
Pts OV rescheduled to 09/05/13@10 :30am. Pt aware of appt. Date and time.

## 2013-08-24 ENCOUNTER — Ambulatory Visit (INDEPENDENT_AMBULATORY_CARE_PROVIDER_SITE_OTHER): Payer: No Typology Code available for payment source | Admitting: Family Medicine

## 2013-08-24 VITALS — BP 165/88 | HR 81 | Temp 98.3°F | Resp 18 | Wt 177.0 lb

## 2013-08-24 DIAGNOSIS — D649 Anemia, unspecified: Secondary | ICD-10-CM

## 2013-08-24 DIAGNOSIS — R319 Hematuria, unspecified: Secondary | ICD-10-CM

## 2013-08-24 DIAGNOSIS — M25519 Pain in unspecified shoulder: Secondary | ICD-10-CM

## 2013-08-24 DIAGNOSIS — G894 Chronic pain syndrome: Secondary | ICD-10-CM

## 2013-08-24 DIAGNOSIS — M25529 Pain in unspecified elbow: Secondary | ICD-10-CM

## 2013-08-24 LAB — POCT URINALYSIS DIPSTICK
Bilirubin, UA: NEGATIVE
Glucose, UA: NEGATIVE
KETONES UA: NEGATIVE
LEUKOCYTES UA: NEGATIVE
NITRITE UA: NEGATIVE
PROTEIN UA: NEGATIVE
Spec Grav, UA: 1.01
Urobilinogen, UA: 0.2
pH, UA: 7

## 2013-08-24 LAB — POCT UA - MICROSCOPIC ONLY
Bacteria, U Microscopic: NEGATIVE
CRYSTALS, UR, HPF, POC: NEGATIVE
Casts, Ur, LPF, POC: NEGATIVE
Mucus, UA: NEGATIVE
WBC, UR, HPF, POC: NEGATIVE
YEAST UA: NEGATIVE

## 2013-08-24 MED ORDER — HYDROCODONE-ACETAMINOPHEN 5-325 MG PO TABS
1.0000 | ORAL_TABLET | Freq: Four times a day (QID) | ORAL | Status: DC | PRN
Start: 2013-08-24 — End: 2013-09-04

## 2013-08-24 MED ORDER — VENLAFAXINE HCL ER 75 MG PO CP24: 75.0000 mg | ORAL_CAPSULE | Freq: Every day | ORAL | Status: DC
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
## 2013-08-24 NOTE — Telephone Encounter (Signed)
rtc 

## 2013-08-24 NOTE — Patient Instructions (Signed)
Start effexor as discussed. Up to 2 hydrocodone every 6 hours - be careful with sedation. I am referring you to pain management and urology. Keep follow up appointment. Return to the clinic or go to the nearest emergency room if any of your symptoms worsen or new symptoms occur.

## 2013-08-25 NOTE — Telephone Encounter (Signed)
Pt came in to be seen yesterday 

## 2013-08-25 NOTE — Telephone Encounter (Signed)
Please call this patient to RTC for evaluation.

## 2013-09-04 ENCOUNTER — Encounter: Payer: Self-pay | Admitting: Family Medicine

## 2013-09-04 ENCOUNTER — Ambulatory Visit (INDEPENDENT_AMBULATORY_CARE_PROVIDER_SITE_OTHER): Payer: No Typology Code available for payment source | Admitting: Family Medicine

## 2013-09-04 VITALS — BP 170/88 | HR 97 | Temp 98.5°F | Resp 16 | Ht 70.25 in | Wt 184.2 lb

## 2013-09-04 DIAGNOSIS — G8929 Other chronic pain: Secondary | ICD-10-CM

## 2013-09-04 DIAGNOSIS — D649 Anemia, unspecified: Secondary | ICD-10-CM

## 2013-09-04 DIAGNOSIS — R195 Other fecal abnormalities: Secondary | ICD-10-CM

## 2013-09-04 DIAGNOSIS — M25529 Pain in unspecified elbow: Secondary | ICD-10-CM

## 2013-09-04 DIAGNOSIS — M25519 Pain in unspecified shoulder: Secondary | ICD-10-CM

## 2013-09-04 DIAGNOSIS — H9190 Unspecified hearing loss, unspecified ear: Secondary | ICD-10-CM

## 2013-09-04 DIAGNOSIS — M2550 Pain in unspecified joint: Secondary | ICD-10-CM

## 2013-09-04 DIAGNOSIS — R1013 Epigastric pain: Secondary | ICD-10-CM

## 2013-09-04 MED ORDER — LISINOPRIL 30 MG PO TABS: 30.0000 mg | ORAL_TABLET | Freq: Every day | ORAL | Status: DC

## 2013-09-04 MED ORDER — HYDROCODONE-ACETAMINOPHEN 5-325 MG PO TABS
1.0000 | ORAL_TABLET | Freq: Four times a day (QID) | ORAL | Status: DC | PRN
Start: 2013-09-04 — End: 2013-09-24

## 2013-09-04 MED ORDER — CARISOPRODOL 350 MG PO TABS: 350.0000 mg | ORAL_TABLET | Freq: Three times a day (TID) | ORAL | Status: DC | PRN
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
## 2013-09-04 NOTE — Patient Instructions (Signed)
I encourage you to start the effexor as discussed to treat chronic pain.  If you are to have similar effects that you had with cymbalta than we can slowly titrate off this medicine, but we know this helps with chronic pain.  Call me in the next two weeks to let me know what frequency you are using the pain medicine and then can provide a refill prior to next visit.  Keep a record of your blood pressures outside of the office and bring them to the next office visit. We will refer you to an ear specialist as discussed.

## 2013-09-05 ENCOUNTER — Encounter: Payer: Self-pay | Admitting: Gastroenterology

## 2013-09-05 ENCOUNTER — Ambulatory Visit (INDEPENDENT_AMBULATORY_CARE_PROVIDER_SITE_OTHER): Payer: No Typology Code available for payment source | Admitting: Gastroenterology

## 2013-09-05 ENCOUNTER — Other Ambulatory Visit: Payer: Self-pay | Admitting: Family Medicine

## 2013-09-05 VITALS — BP 182/64 | HR 84 | Ht 69.0 in | Wt 182.1 lb

## 2013-09-05 DIAGNOSIS — R195 Other fecal abnormalities: Secondary | ICD-10-CM

## 2013-09-05 DIAGNOSIS — D649 Anemia, unspecified: Secondary | ICD-10-CM

## 2013-09-05 NOTE — Patient Instructions (Signed)
Go to the basement today to pick up your hemoccult kit Please return within 2 weeks

## 2013-09-05 NOTE — Assessment & Plan Note (Signed)
I suspect that Hemoccult-positive stool was related to chronic NSAID use.  Erosions or active peptic disease are most likely.  At this point I would repeat Hemoccults since he has been off NSAIDs for over a month.  Should they be negative I would not pursue GI workup any further.  Note that his hemoglobin is normal.

## 2013-09-05 NOTE — Progress Notes (Signed)
_                                                                                                                History of Present Illness: Johnny Bryant 63 year old white male referred for evaluation of Hemoccult-positive stool.  This was noted on routine testing.  His only GI complaint is mild constipation.  He's taking narcotics for joint pain.  Up until one month ago he was taking 800 mg ibuprofen at least 4 times a day.  He denies abdominal pain, pyrosis or dysphagia.  Colonoscopy in 2010 demonstrated diverticulosis.  Hemoglobin last month was 14.6.    Past Medical History  Diagnosis Date  . Hypertension   . Hypercholesteremia   . Arthritis    Past Surgical History  Procedure Laterality Date  . Elbow surgery Right   . Shoulder surgery Left     x 3  . Cosmetic surgery    . Fracture surgery Left     wrist  . Vasectomy     family history includes Cancer in his paternal grandmother; Diabetes in his father; Heart attack in his maternal grandfather and paternal grandfather; Heart disease in his father; Hyperlipidemia in his father and mother; Hypertension in his father and mother; Stroke in his mother. Current Outpatient Prescriptions  Medication Sig Dispense Refill  . amitriptyline (ELAVIL) 25 MG tablet Take 25 mg by mouth daily.       . carisoprodol (SOMA) 350 MG tablet Take 1 tablet (350 mg total) by mouth 3 (three) times daily as needed for muscle spasms.  30 tablet  0  . HYDROcodone-acetaminophen (NORCO/VICODIN) 5-325 MG per tablet Take 1-2 tablets by mouth every 6 (six) hours as needed for moderate pain.  80 tablet  0  . lisinopril (PRINIVIL,ZESTRIL) 30 MG tablet Take 1 tablet (30 mg total) by mouth daily.  30 tablet  2  . omeprazole (PRILOSEC) 20 MG capsule Take 1 capsule (20 mg total) by mouth daily.  30 capsule  3  . pravastatin (PRAVACHOL) 20 MG tablet Take 20 mg by mouth daily.      Marland Kitchen venlafaxine XR (EFFEXOR XR) 75 MG 24 hr capsule Take 1 capsule (75 mg total)  by mouth daily with breakfast. Then increase to 2 capsules by mouth daily after 1 week.  60 capsule  0   No current facility-administered medications for this visit.   Allergies as of 09/05/2013 - Review Complete 09/05/2013  Allergen Reaction Noted  . Cymbalta [duloxetine hcl] Other (See Comments) 09/05/2013    reports that he quit smoking about 33 years ago. His smoking use included Cigarettes. He smoked 0.00 packs per day. He has never used smokeless tobacco. He reports that he does not drink alcohol or use illicit drugs.     Review of Systems: He complains of joint pains Pertinent positive and negative review of systems were noted in the above HPI section. All other review of systems were otherwise negative.  Vital signs were reviewed in today's medical record Physical Exam: General: Well  developed , well nourished, no acute distress Skin: anicteric Head: Normocephalic and atraumatic Eyes:  sclerae anicteric, EOMI Ears: Normal auditory acuity Mouth: No deformity or lesions Neck: Supple, no masses or thyromegaly Lungs: Clear throughout to auscultation Heart: Regular rate and rhythm; no murmurs, rubs or bruits Abdomen: Soft, non tender and non distended. No masses, hepatosplenomegaly or hernias noted. Normal Bowel sounds Rectal:deferred Musculoskeletal: Symmetrical with no gross deformities  Skin: No lesions on visible extremities Pulses:  Normal pulses noted Extremities: No clubbing, cyanosis, edema or deformities noted Neurological: Alert oriented x 4, grossly nonfocal Cervical Nodes:  No significant cervical adenopathy Inguinal Nodes: No significant inguinal adenopathy Psychological:  Alert and cooperative. Normal mood and affect  See Assessment and Plan under Problem List

## 2013-09-06 ENCOUNTER — Telehealth: Payer: Self-pay

## 2013-09-06 DIAGNOSIS — D649 Anemia, unspecified: Secondary | ICD-10-CM

## 2013-09-06 DIAGNOSIS — M25529 Pain in unspecified elbow: Secondary | ICD-10-CM

## 2013-09-06 NOTE — Telephone Encounter (Signed)
PT STATES HE HAD SEEN DR Carlota Raspberry AND HAD HIS MEDICINES REFILLED BUT HE NEEDED A REFILL ON HIS SOMA AND IT WAS DENIED DIDN'T KNOW WHY. PLEASE CALL [phone]

## 2013-09-07 NOTE — Telephone Encounter (Signed)
Patient states that he is still unable to get his Soma. He states he would really like to pick this up today. [facility]  [phone]

## 2013-09-07 NOTE — Telephone Encounter (Signed)
Spoke to pt- he states he did not receive the paper script at his OV- I have called into the pharmacy for him.

## 2013-09-15 ENCOUNTER — Ambulatory Visit: Payer: Self-pay | Admitting: Gastroenterology

## 2013-09-18 NOTE — Telephone Encounter (Signed)
Pended for review

## 2013-09-18 NOTE — Telephone Encounter (Signed)
Pt states he is going to [place] next week and will need to have his oxy (vicodin) early.   Best phone for pt is [phone]

## 2013-09-20 ENCOUNTER — Telehealth: Payer: Self-pay

## 2013-09-20 DIAGNOSIS — M25529 Pain in unspecified elbow: Secondary | ICD-10-CM

## 2013-09-20 DIAGNOSIS — D649 Anemia, unspecified: Secondary | ICD-10-CM

## 2013-09-20 NOTE — Telephone Encounter (Signed)
Pt would like a refill on his pain medication to be picked up on Monday 3/23. He is going to [place] this weekend for a wedding. He will have enough through this weekend.  Pt states his BP has been steadily improving.  Reset activation code for MyChart- sent pt email to activate his mychart account.    DUPLICATE MESSAGE

## 2013-09-20 NOTE — Telephone Encounter (Signed)
See other note.   Today would be 16 days from last ov - which if he was needing 5 pills per day, would be out.  If he does not need them refilled now, when will he be out (so he does not run out when out of town or prior to next OV with me on April 6th)?.  Let me know and can have a Rx available for him to pick up prior to that ov.

## 2013-09-20 NOTE — Telephone Encounter (Signed)
Pt would like a refill on his pain medication to be picked up on Monday 3/23. He is going to [place] this weekend for a wedding. He will have enough through this weekend.   Pt states his BP has been steadily improving.   Reset activation code for MyChart- sent pt email to activate his mychart account.

## 2013-09-20 NOTE — Telephone Encounter (Signed)
LMOM for pt CB.

## 2013-09-20 NOTE — Telephone Encounter (Signed)
Patient is calling to let us know that he does not need his pain pills refilled and that his blood pressure is doing great would like to let dr Carlota Raspberry know this

## 2013-09-20 NOTE — Telephone Encounter (Signed)
We can do that, but plan at last ov was a phone call with update on how often he is taking the pain medicine and soma so we can get an accurate Rx. Please call him and ask how may of the Vicodin he has been taking, and how many are left at this point. Also - what day does he leave and when will he be back? Thanks.

## 2013-09-24 MED ORDER — HYDROCODONE-ACETAMINOPHEN 5-325 MG PO TABS: 1.0000 | ORAL_TABLET | Freq: Four times a day (QID) | ORAL | Status: DC | PRN

## 2013-09-24 NOTE — Telephone Encounter (Signed)
Refilled hydrocodone - available for pickup. Can discuss dosing and effectiveness at upcoming appt with me. Glad to hear his BP's are improving!

## 2013-09-25 NOTE — Telephone Encounter (Signed)
Lm for pt- script available to pick up.

## 2013-09-25 NOTE — Telephone Encounter (Signed)
Rx in drawer. 

## 2013-09-26 ENCOUNTER — Other Ambulatory Visit: Payer: Self-pay | Admitting: Family Medicine

## 2013-09-27 NOTE — Telephone Encounter (Signed)
Called in soma

## 2013-09-28 ENCOUNTER — Encounter: Payer: Self-pay | Admitting: *Deleted

## 2013-09-28 DIAGNOSIS — H905 Unspecified sensorineural hearing loss: Secondary | ICD-10-CM | POA: Insufficient documentation

## 2013-09-28 DIAGNOSIS — H9120 Sudden idiopathic hearing loss, unspecified ear: Secondary | ICD-10-CM | POA: Insufficient documentation

## 2013-10-06 ENCOUNTER — Telehealth: Payer: Self-pay

## 2013-10-06 NOTE — Telephone Encounter (Signed)
Patient stated he seen three different specialist and each specialist are requesting blood work to be done. Patient want to know can he have his blood work done here at the office to prevent from having blood work done at specialist. He request for Dr. Carlota Raspberry to give him a call [phone]

## 2013-10-07 NOTE — Telephone Encounter (Signed)
Left message on machine to call back  

## 2013-10-08 NOTE — Telephone Encounter (Signed)
Pt has an appt with Dr. Carlota Raspberry tomorrow. Will bring a list of what all he needs drawn with him, and I told him we would be able to do this for him.   Dr. Duwaine Maxin

## 2013-10-09 ENCOUNTER — Encounter: Payer: Self-pay | Admitting: Family Medicine

## 2013-10-09 ENCOUNTER — Ambulatory Visit (INDEPENDENT_AMBULATORY_CARE_PROVIDER_SITE_OTHER): Payer: No Typology Code available for payment source | Admitting: Family Medicine

## 2013-10-09 VITALS — BP 140/68 | HR 94 | Temp 98.3°F | Resp 16 | Ht 69.5 in | Wt 179.0 lb

## 2013-10-09 DIAGNOSIS — D649 Anemia, unspecified: Secondary | ICD-10-CM

## 2013-10-09 DIAGNOSIS — Z111 Encounter for screening for respiratory tuberculosis: Secondary | ICD-10-CM

## 2013-10-09 DIAGNOSIS — M25529 Pain in unspecified elbow: Secondary | ICD-10-CM

## 2013-10-09 DIAGNOSIS — Z8249 Family history of ischemic heart disease and other diseases of the circulatory system: Secondary | ICD-10-CM

## 2013-10-09 DIAGNOSIS — E785 Hyperlipidemia, unspecified: Secondary | ICD-10-CM

## 2013-10-09 DIAGNOSIS — I10 Essential (primary) hypertension: Secondary | ICD-10-CM

## 2013-10-09 DIAGNOSIS — R002 Palpitations: Secondary | ICD-10-CM

## 2013-10-09 DIAGNOSIS — G894 Chronic pain syndrome: Secondary | ICD-10-CM

## 2013-10-09 DIAGNOSIS — Z79899 Other long term (current) drug therapy: Secondary | ICD-10-CM

## 2013-10-09 LAB — POCT UA - MICROSCOPIC ONLY
Casts, Ur, LPF, POC: NEGATIVE
Crystals, Ur, HPF, POC: NEGATIVE
EPITHELIAL CELLS, URINE PER MICROSCOPY: NEGATIVE
Mucus, UA: NEGATIVE
WBC, Ur, HPF, POC: NEGATIVE
YEAST UA: NEGATIVE

## 2013-10-09 LAB — POCT URINALYSIS DIPSTICK
Bilirubin, UA: NEGATIVE
Glucose, UA: NEGATIVE
Ketones, UA: NEGATIVE
Leukocytes, UA: NEGATIVE
Nitrite, UA: NEGATIVE
Protein, UA: NEGATIVE
Spec Grav, UA: 1.01
UROBILINOGEN UA: 0.2
pH, UA: 7.5

## 2013-10-09 LAB — ACUTE HEP PANEL AND HEP B SURFACE AB
HCV Ab: NEGATIVE
HEP A IGM: NONREACTIVE
Hep B C IgM: NONREACTIVE
Hep B S Ab: NEGATIVE
Hepatitis B Surface Ag: NEGATIVE

## 2013-10-09 LAB — COMPLETE METABOLIC PANEL WITH GFR
ALBUMIN: 4.6 g/dL (ref 3.5–5.2)
ALT: 27 U/L (ref 0–53)
AST: 22 U/L (ref 0–37)
Alkaline Phosphatase: 73 U/L (ref 39–117)
BILIRUBIN TOTAL: 0.4 mg/dL (ref 0.2–1.2)
BUN: 10 mg/dL (ref 6–23)
CO2: 26 meq/L (ref 19–32)
Calcium: 9.5 mg/dL (ref 8.4–10.5)
Chloride: 98 mEq/L (ref 96–112)
Creat: 0.73 mg/dL (ref 0.50–1.35)
GLUCOSE: 97 mg/dL (ref 70–99)
Potassium: 3.9 mEq/L (ref 3.5–5.3)
SODIUM: 136 meq/L (ref 135–145)
TOTAL PROTEIN: 7.1 g/dL (ref 6.0–8.3)

## 2013-10-09 LAB — IFOBT (OCCULT BLOOD): IMMUNOLOGICAL FECAL OCCULT BLOOD TEST: NEGATIVE

## 2013-10-09 LAB — SEDIMENTATION RATE: Sed Rate: 6 mm/hr (ref 0–16)

## 2013-10-09 LAB — ANGIOTENSIN CONVERTING ENZYME: ANGIOTENSIN-CONVERTING ENZYME: 1 U/L — AB (ref 8–52)

## 2013-10-09 MED ORDER — HYDROCODONE-ACETAMINOPHEN 5-325 MG PO TABS: 1.0000 | ORAL_TABLET | Freq: Four times a day (QID) | ORAL | Status: DC | PRN

## 2013-10-09 MED ORDER — CARISOPRODOL 350 MG PO TABS: ORAL_TABLET | ORAL | Status: DC

## 2013-10-09 MED ORDER — PRAVASTATIN SODIUM 20 MG PO TABS: 20.0000 mg | ORAL_TABLET | Freq: Every day | ORAL | Status: DC

## 2013-10-09 MED ORDER — LISINOPRIL 30 MG PO TABS: 30.0000 mg | ORAL_TABLET | Freq: Every day | ORAL | Status: DC

## 2013-10-09 NOTE — Progress Notes (Addendum)
Subjective:  This chart was scribed for [name], MD by Roxan Diesel, Scribe.  This patient was seen in Room 22 and the patient's care was started at 1:57 PM.   Patient ID: Johnny Bryant, male    DOB: 1951-02-28, 63 y.o.   MRN: [id number]   HPI  Johnny Bryant is a 63 y.o. male PCP: Carlota Raspberry, Marzell Allemand R, MD  Pt here for follow-up.  Last OV 1 month ago.    1. Chronic pain and polyarthralgia, referred to rheumatology.  I have been treating with Soma and hydrocodone for current management until further evaluation completed with specialist.  Discussed Effexor in past, but some initial concerns so had not taken this.  Prior average 5 hydrocodone/day, and Soma 3x/day.  He is now taking 5-6 hydrocodone/day and Tylenol, which are managing his pain well.  He was taking Soma as needed but ran out of this 4-5 days ago.  He denies any new side effects.  He recently flew to [facility] for a wedding and states his pain has been slightly worse since he returned home.    2. Anemia, with heme-positive stool.  Suspected NSAID component (erosions or peptic disease), now off of these.  Evaluation by GI one month ago.  Hemoccults sent with patient from GI, and if negative no further workup needed per GI.  Last hemoglobin was normal at 14.6 in February.  3. Microscopic hematuria.  Normal PSA and creatinine.  Plan for CT and cystoscopy.  Plan to f/u with Dr. Tresa Moore in approximately one week.  4. Hypertension.  Increased lisinopril to 30mg /day at last visit.  Pt is concerned that when he went to his urologist last week his BP was 190/97.  BP today is 140/68 which he states is the best it has been recently.  5. Hypercholesterolemia.  He is taking Pravachol and needs a refill on this.  6. Sensorineural hearing loss, followed by ENT.  7. Palpitations.  He states he has been feeling "my heart beating fast" every 2-3 days in episodes lasting 20 minutes to 2 hours.  He initially developed these episodes after he  started taking Effexor, which he took for 4 days, but he stopped taking this 3 weeks ago and continued to experience the episodes.  He states he feels slightly fatigued during these episodes ("I don't feel right, like I need to lay down, like I'm tired").  He denies associated CP, SOB, diaphoresis, or dizziness.  He denies prior h/o similar symptoms. He denies known personal h/o heart problems but does admits to a family history including his father who had an MI at age 82.  He has not had a stress test.  Last TSH was normal (0.7) 2 months ago.  There is no EKG in the system.  He is not taking aspirin.  He has not used tobacco in the past 30 years.   Patient Active Problem List   Diagnosis Date Noted  . Sudden hearing loss, unspecified 09/28/2013  . Sensorineural hearing loss, unspecified 09/28/2013  . Chronic pain syndrome 08/18/2013  . Hematuria 08/18/2013  . Heme positive stool 08/18/2013    Past Medical History  Diagnosis Date  . Hypertension   . Hypercholesteremia   . Arthritis     Past Surgical History  Procedure Laterality Date  . Elbow surgery Right   . Shoulder surgery Left     x 3  . Cosmetic surgery    . Fracture surgery Left     wrist  . Vasectomy  Allergies  Allergen Reactions  . Cymbalta [Duloxetine Hcl] Other (See Comments)    Cranky and moods changes     Prior to Admission medications   Medication Sig Start Date End Date Taking? Authorizing Provider  amitriptyline (ELAVIL) 25 MG tablet take 1 tablet by mouth at bedtime   Yes [name], MD  carisoprodol (SOMA) 350 MG tablet take 1 tablet by mouth three times a day if needed for MUSCLE SPASM   Yes [name], MD  HYDROcodone-acetaminophen (NORCO/VICODIN) 5-325 MG per tablet Take 1-2 tablets by mouth every 6 (six) hours as needed for moderate pain. 09/24/13  Yes [name], MD  lisinopril (PRINIVIL,ZESTRIL) 30 MG tablet Take 1 tablet (30 mg total) by mouth daily. 09/04/13  Yes [name], MD  omeprazole (PRILOSEC) 20 MG capsule Take 1 capsule (20 mg total) by mouth daily. 08/18/13  Yes [name], MD  pravastatin (PRAVACHOL) 20 MG tablet Take 20 mg by mouth daily.   Yes Historical Provider, MD  venlafaxine XR (EFFEXOR XR) 75 MG 24 hr capsule Take 1 capsule (75 mg total) by mouth daily with breakfast. Then increase to 2 capsules by mouth daily after 1 week. 08/24/13   [name], MD    History   Social History  . Marital Status: Married    Spouse Name: N/A    Number of Children: 2  . Years of Education: N/A   Occupational History  . sales    Social History Main Topics  . Smoking status: Former Smoker    Types: Cigarettes    Quit date: 11/19/1979  . Smokeless tobacco: Never Used  . Alcohol Use: No  . Drug Use: No  . Sexual Activity: Not on file   Other Topics Concern  . Not on file   Social History Narrative  . No narrative on file      Review of Systems  Constitutional: Negative for fatigue and unexpected weight change.  Eyes: Negative for visual disturbance.  Respiratory: Negative for cough, chest tightness and shortness of breath.   Cardiovascular: Positive for palpitations. Negative for chest pain and leg swelling.  Gastrointestinal: Negative for abdominal pain and blood in stool.  Neurological: Negative for dizziness, light-headedness and headaches.         Objective:   Physical Exam  Vitals reviewed. Constitutional: He is oriented to person, place, and time. He appears well-developed and well-nourished.  HENT:  Head: Normocephalic and atraumatic.  Eyes: EOM are normal. Pupils are equal, round, and reactive to light.  Neck: No JVD present. Carotid bruit is not present.  Cardiovascular: Normal rate, regular rhythm and normal heart sounds.   No murmur heard. Pulmonary/Chest: Effort normal and breath sounds normal. He has no rales.  Genitourinary:  Minimal amount of brown stool on DRE.   Musculoskeletal: He exhibits no  edema.  Neurological: He is alert and oriented to person, place, and time.  Skin: Skin is warm and dry.  Psychiatric: He has a normal mood and affect.   BP 140/68  Pulse 94  Temp(Src) 98.3 F (36.8 C) (Oral)  Resp 16  Ht 5' 9.5" (1.765 m)  Wt 179 lb (81.194 kg)  BMI 26.06 kg/m2  SpO2 100%  EKG: Sinus rhythm.  Nonspecific ST change in III and AVF, plus V2-V3.  No prior EKG available for review. Results for orders placed in visit on 10/09/13  IFOBT (OCCULT BLOOD)      Result Value Ref Range   IFOBT  Negative         Assessment & Plan:   Johnny Bryant is a 63 y.o. male  Anemia - Plan: HYDROcodone-acetaminophen (NORCO/VICODIN) 5-325 MG per tablet, Glucose 6 phosphate dehydrogenase, Pan-ANCA, Cryoglobulin, POCT urinalysis dipstick, POCT UA - Microscopic Only, Cardiolipin antibodies, IgG, IgM, IgA, Lupus anticoagulant panel, Beta-2 glycoprotein antibodies, C3 and C4, Extractable Nuclear Antigen Antibody, Cyclic Citrul Peptide Antibody, IGG  Pain in joint, upper arm - Plan: HYDROcodone-acetaminophen (NORCO/VICODIN) 5-325 MG per tablet, Sedimentation Rate, Acute Hep Panel & Hep B Surface Ab, Glucose 6 phosphate dehydrogenase, Pan-ANCA, Cryoglobulin, POCT urinalysis dipstick, POCT UA - Microscopic Only, Cardiolipin antibodies, IgG, IgM, IgA, Lupus anticoagulant panel, Beta-2 glycoprotein antibodies, C3 and C4, Extractable Nuclear Antigen Antibody, Cyclic Citrul Peptide Antibody, IGG, COMPLETE METABOLIC PANEL WITH GFR, Angiotensin converting enzyme, CANCELED: Lupus anticoagulant panel  Other and unspecified hyperlipidemia - Plan: pravastatin (PRAVACHOL) 20 MG tablet, Sedimentation Rate, Pan-ANCA, Cryoglobulin, Cardiolipin antibodies, IgG, IgM, IgA, Lupus anticoagulant panel, Beta-2 glycoprotein antibodies, C3 and C4, Extractable Nuclear Antigen Antibody, Cyclic Citrul Peptide Antibody, IGG, COMPLETE METABOLIC PANEL WITH GFR, Angiotensin converting enzyme  Chronic pain syndrome - Plan:  carisoprodol (SOMA) 350 MG tablet, Sedimentation Rate, Acute Hep Panel & Hep B Surface Ab, Glucose 6 phosphate dehydrogenase, Pan-ANCA, Cryoglobulin, POCT urinalysis dipstick, POCT UA - Microscopic Only, Cardiolipin antibodies, IgG, IgM, IgA, Lupus anticoagulant panel, Beta-2 glycoprotein antibodies, C3 and C4, Extractable Nuclear Antigen Antibody, Cyclic Citrul Peptide Antibody, IGG, COMPLETE METABOLIC PANEL WITH GFR, Angiotensin converting enzyme, CANCELED: Lupus anticoagulant panel  Palpitations - Plan: Ambulatory referral to Cardiology, EKG 12-Lead, IFOBT POC (occult bld, rslt in office), Sedimentation Rate, Acute Hep Panel & Hep B Surface Ab, Glucose 6 phosphate dehydrogenase, Pan-ANCA, Cryoglobulin, POCT urinalysis dipstick, POCT UA - Microscopic Only, Cardiolipin antibodies, IgG, IgM, IgA, Lupus anticoagulant panel, Beta-2 glycoprotein antibodies, C3 and C4, Extractable Nuclear Antigen Antibody, Cyclic Citrul Peptide Antibody, IGG, COMPLETE METABOLIC PANEL WITH GFR, Angiotensin converting enzyme, Quantiferon tb gold assay  Family history of cardiovascular disease - Plan: Sedimentation Rate, Acute Hep Panel & Hep B Surface Ab, Glucose 6 phosphate dehydrogenase, Pan-ANCA, Cryoglobulin, POCT urinalysis dipstick, POCT UA - Microscopic Only, Cardiolipin antibodies, IgG, IgM, IgA, Lupus anticoagulant panel, Beta-2 glycoprotein antibodies, C3 and C4, Extractable Nuclear Antigen Antibody, Cyclic Citrul Peptide Antibody, IGG, COMPLETE METABOLIC PANEL WITH GFR, Angiotensin converting enzyme, Quantiferon tb gold assay  HTN (hypertension) - Plan: lisinopril (PRINIVIL,ZESTRIL) 30 MG tablet, Sedimentation Rate, Acute Hep Panel & Hep B Surface Ab, Glucose 6 phosphate dehydrogenase, Pan-ANCA, Cryoglobulin, POCT urinalysis dipstick, POCT UA - Microscopic Only, Cardiolipin antibodies, IgG, IgM, IgA, Lupus anticoagulant panel, Beta-2 glycoprotein antibodies, C3 and C4, Extractable Nuclear Antigen Antibody, Cyclic Citrul  Peptide Antibody, IGG, COMPLETE METABOLIC PANEL WITH GFR, Angiotensin converting enzyme  Encounter for long-term (current) use of other medications - Plan: Sedimentation Rate, Acute Hep Panel & Hep B Surface Ab, Glucose 6 phosphate dehydrogenase, Pan-ANCA, Cryoglobulin, POCT urinalysis dipstick, POCT UA - Microscopic Only, Cardiolipin antibodies, IgG, IgM, IgA, Lupus anticoagulant panel, Beta-2 glycoprotein antibodies, C3 and C4, Extractable Nuclear Antigen Antibody, Cyclic Citrul Peptide Antibody, IGG, COMPLETE METABOLIC PANEL WITH GFR, Angiotensin converting enzyme  Screening examination for pulmonary tuberculosis - Plan: Sedimentation Rate, Acute Hep Panel & Hep B Surface Ab, Glucose 6 phosphate dehydrogenase, Pan-ANCA, Cryoglobulin, POCT urinalysis dipstick, POCT UA - Microscopic Only, Cardiolipin antibodies, IgG, IgM, IgA, Lupus anticoagulant panel, Beta-2 glycoprotein antibodies, C3 and C4, Extractable Nuclear Antigen Antibody, Cyclic Citrul Peptide Antibody, IGG, COMPLETE METABOLIC PANEL WITH GFR, Angiotensin converting enzyme, Quantiferon tb gold  assay   Chronic pain- stable. Labs above checked for rheumatology - will need to be sent to Dr. Estanislado Pandy when resulted.  Refilled hydorcodone and soma for now as bridge to either other medication treatment if rheumatologic d/o identified or may need to be transitioned to pain management. Plan on recheck in 4 weeks. Depending on frequency of med use, may need to refill prior to that ov. Hold on further SNRI as palpitations appeared to be correlated with Effexor.   HTN - improved. Reading here ok.  May have some white coat component with other elevations, but discussed if continued fluctuations and spikes, may need to look at secondary HTN causes, such as metanephrine testing.   Palpitations - recent onset, initally with Effexor, but now persistent, and with fatigue sx's at time of onset.  FH of CAD and without prior stress testing.  CVD risk factors of  age, HTN, hyperlipidemia.  Will refer to cardiology for eval in next 5 days if possible, but ER/RTC/911 precautions discussed. Held on ASA until discussed with his gastroenterologist, but heme negative stool today, so based on prior note from GI, may be able to start this if needed.   Hearing loss - sensourineural - follow up with ENT.   Microscopic hematuria - CMP pending for creatinine as requested by urology - copy of GFR to urology when returned. Further eval with CT and cystoscopy pending.   Hyperlipidemia - refilled pravachol, but may need increase to at least 40mg  qd for moderate intensity statin by ATP IV. Also due for repeat fasting lipid panel - can be discussed after eval of above or with cardiology for risk assessment.    Meds ordered this encounter  Medications  . carisoprodol (SOMA) 350 MG tablet    Sig: take 1 tablet by mouth three times a day if needed for MUSCLE SPASM    Dispense:  30 tablet    Refill:  1  . HYDROcodone-acetaminophen (NORCO/VICODIN) 5-325 MG per tablet    Sig: Take 1-2 tablets by mouth every 6 (six) hours as needed for moderate pain.    Dispense:  84 tablet    Refill:  0  . lisinopril (PRINIVIL,ZESTRIL) 30 MG tablet    Sig: Take 1 tablet (30 mg total) by mouth daily.    Dispense:  30 tablet    Refill:  2  . pravastatin (PRAVACHOL) 20 MG tablet    Sig: Take 1 tablet (20 mg total) by mouth daily.    Dispense:  90 tablet    Refill:  1   Patient Instructions  Decrease to 1 cup coffee per day.  We will refer you to cardiologist for your palpitations. If any worsening prior to that visit - return here or emergency room.  Same pain meds at this point until more information known from rheumatologist.  Once we receive the labs we can send those on to rheumatologist.  Plan on recheck in next 2-3 weeks, sooner if worse.  Return to the clinic or go to the nearest emergency room if any of your symptoms worsen or new symptoms occur - especially if presistent  palpitations or any chest pain, shortness of breath or lightheadedness, or sweating with palpitations.    Palpitations  A palpitation is the feeling that your heartbeat is irregular or is faster than normal. It may feel like your heart is fluttering or skipping a beat. Palpitations are usually not a serious problem. However, in some cases, you may need further medical evaluation. CAUSES  Palpitations  can be caused by:  Smoking.  Caffeine or other stimulants, such as diet pills or energy drinks.  Alcohol.  Stress and anxiety.  Strenuous physical activity.  Fatigue.  Certain medicines.  Heart disease, especially if you have a history of arrhythmias. This includes atrial fibrillation, atrial flutter, or supraventricular tachycardia.  An improperly working pacemaker or defibrillator. DIAGNOSIS  To find the cause of your palpitations, your caregiver will take your history and perform a physical exam. Tests may also be done, including:  Electrocardiography (ECG). This test records the heart's electrical activity.  Cardiac monitoring. This allows your caregiver to monitor your heart rate and rhythm in real time.  Holter monitor. This is a portable device that records your heartbeat and can help diagnose heart arrhythmias. It allows your caregiver to track your heart activity for several days, if needed.  Stress tests by exercise or by giving medicine that makes the heart beat faster. TREATMENT  Treatment of palpitations depends on the cause of your symptoms and can vary greatly. Most cases of palpitations do not require any treatment other than time, relaxation, and monitoring your symptoms. Other causes, such as atrial fibrillation, atrial flutter, or supraventricular tachycardia, usually require further treatment. HOME CARE INSTRUCTIONS   Avoid:  Caffeinated coffee, tea, soft drinks, diet pills, and energy drinks.  Chocolate.  Alcohol.  Stop smoking if you smoke.  Reduce  your stress and anxiety. Things that can help you relax include:  A method that measures bodily functions so you can learn to control them (biofeedback).  Yoga.  Meditation.  Physical activity such as swimming, jogging, or walking.  Get plenty of rest and sleep. SEEK MEDICAL CARE IF:   You continue to have a fast or irregular heartbeat beyond 24 hours.  Your palpitations occur more often. SEEK IMMEDIATE MEDICAL CARE IF:  You develop chest pain or shortness of breath.  You have a severe headache.  You feel dizzy, or you faint. MAKE SURE YOU:  Understand these instructions.  Will watch your condition.  Will get help right away if you are not doing well or get worse. Document Released: 06/19/2000 Document Revised: 10/17/2012 Document Reviewed: 08/21/2011 [facility] Patient Information 2014 [place].        I personally performed the services described in this documentation, which was scribed in my presence. The recorded information has been reviewed and considered, and addended by me as needed.

## 2013-10-09 NOTE — Patient Instructions (Addendum)
Decrease to 1 cup coffee per day.  We will refer you to cardiologist for your palpitations. If any worsening prior to that visit - return here or emergency room.  Same pain meds at this point until more information known from rheumatologist.  Once we receive the labs we can send those on to rheumatologist.  Plan on recheck in next 2-3 weeks, sooner if worse.  Return to the clinic or go to the nearest emergency room if any of your symptoms worsen or new symptoms occur - especially if presistent palpitations or any chest pain, shortness of breath or lightheadedness, or sweating with palpitations.    Palpitations  A palpitation is the feeling that your heartbeat is irregular or is faster than normal. It may feel like your heart is fluttering or skipping a beat. Palpitations are usually not a serious problem. However, in some cases, you may need further medical evaluation. CAUSES  Palpitations can be caused by:  Smoking.  Caffeine or other stimulants, such as diet pills or energy drinks.  Alcohol.  Stress and anxiety.  Strenuous physical activity.  Fatigue.  Certain medicines.  Heart disease, especially if you have a history of arrhythmias. This includes atrial fibrillation, atrial flutter, or supraventricular tachycardia.  An improperly working pacemaker or defibrillator. DIAGNOSIS  To find the cause of your palpitations, your caregiver will take your history and perform a physical exam. Tests may also be done, including:  Electrocardiography (ECG). This test records the heart's electrical activity.  Cardiac monitoring. This allows your caregiver to monitor your heart rate and rhythm in real time.  Holter monitor. This is a portable device that records your heartbeat and can help diagnose heart arrhythmias. It allows your caregiver to track your heart activity for several days, if needed.  Stress tests by exercise or by giving medicine that makes the heart beat faster. TREATMENT   Treatment of palpitations depends on the cause of your symptoms and can vary greatly. Most cases of palpitations do not require any treatment other than time, relaxation, and monitoring your symptoms. Other causes, such as atrial fibrillation, atrial flutter, or supraventricular tachycardia, usually require further treatment. HOME CARE INSTRUCTIONS   Avoid:  Caffeinated coffee, tea, soft drinks, diet pills, and energy drinks.  Chocolate.  Alcohol.  Stop smoking if you smoke.  Reduce your stress and anxiety. Things that can help you relax include:  A method that measures bodily functions so you can learn to control them (biofeedback).  Yoga.  Meditation.  Physical activity such as swimming, jogging, or walking.  Get plenty of rest and sleep. SEEK MEDICAL CARE IF:   You continue to have a fast or irregular heartbeat beyond 24 hours.  Your palpitations occur more often. SEEK IMMEDIATE MEDICAL CARE IF:  You develop chest pain or shortness of breath.  You have a severe headache.  You feel dizzy, or you faint. MAKE SURE YOU:  Understand these instructions.  Will watch your condition.  Will get help right away if you are not doing well or get worse. Document Released: 06/19/2000 Document Revised: 10/17/2012 Document Reviewed: 08/21/2011 [facility] Patient Information 2014 [place].

## 2013-10-10 ENCOUNTER — Telehealth: Payer: Self-pay

## 2013-10-10 ENCOUNTER — Telehealth: Payer: Self-pay | Admitting: *Deleted

## 2013-10-10 ENCOUNTER — Other Ambulatory Visit: Payer: Self-pay | Admitting: *Deleted

## 2013-10-10 DIAGNOSIS — D649 Anemia, unspecified: Secondary | ICD-10-CM

## 2013-10-10 LAB — EXTRACTABLE NUCLEAR ANTIGEN ANTIBODY
ENA SM Ab Ser-aCnc: <1
SM/RNP: <1
SSA (Ro) (ENA) Antibody, IgG: <1
SSB (La) (ENA) Antibody, IgG: <1
Scleroderma (Scl-70) (ENA) Antibody, IgG: <1
ds DNA Ab: 2 IU/mL

## 2013-10-10 LAB — C3 AND C4
C3 Complement: 145 mg/dL (ref 90–180)
C4 COMPLEMENT: 27 mg/dL (ref 10–40)

## 2013-10-10 LAB — CYCLIC CITRUL PEPTIDE ANTIBODY, IGG: Cyclic Citrullin Peptide Ab: <2 U/mL (ref 0.0–5.0)

## 2013-10-10 LAB — LUPUS ANTICOAGULANT PANEL
DRVVT: 40 secs (ref <42.9)
Lupus Anticoagulant: NOT DETECTED
PTT LA: 37.6 s (ref 28.0–43.0)

## 2013-10-10 LAB — BETA-2 GLYCOPROTEIN ANTIBODIES
BETA-2-GLYCOPROTEIN I IGM: 14 M Units (ref <20)
Beta-2 Glyco I IgG: 0 G Units (ref <20)
Beta-2-Glycoprotein I IgA: 11 A Units (ref <20)

## 2013-10-10 LAB — CARDIOLIPIN ANTIBODIES, IGG, IGM, IGA
ANTICARDIOLIPIN IGG: 3 GPL U/mL (ref <23)
Anticardiolipin IgA: 5 APL U/mL (ref <22)
Anticardiolipin IgM: 4 MPL U/mL (ref <11)

## 2013-10-10 LAB — GLUCOSE 6 PHOSPHATE DEHYDROGENASE: G-6PDH: 12.7 U/g{Hb} (ref 7.0–20.5)

## 2013-10-10 NOTE — Telephone Encounter (Signed)
Referrals: [facility] called about this pt saying that they didn't have any urgent 5 day consult openings available, but that if you called [phone] and spoke with the triage nurse, we could probably get them through. Can we do this please.

## 2013-10-10 NOTE — Telephone Encounter (Signed)
lmom to come back in to draw blood Cryoglobulin serum from red top.  We drew wrong top.

## 2013-10-11 LAB — QUANTIFERON TB GOLD ASSAY (BLOOD)
Interferon Gamma Release Assay: NEGATIVE
MITOGEN VALUE: 9.51 [IU]/mL
Quantiferon Nil Value: 0.02 IU/mL
Quantiferon Tb Ag Minus Nil Value: 0.04 IU/mL
TB Ag value: 0.06 IU/mL

## 2013-10-11 LAB — ANCA TITERS: P-ANCA: 1:320 {titer} — ABNORMAL HIGH

## 2013-10-11 LAB — PAN-ANCA
Atypical p-ANCA Screen: NEGATIVE
Myeloperoxidase Abs: 2.8 — ABNORMAL HIGH
Serine Protease 3: <1
c-ANCA Screen: NEGATIVE
p-ANCA Screen: POSITIVE — AB

## 2013-10-13 ENCOUNTER — Telehealth: Payer: Self-pay

## 2013-10-13 NOTE — Telephone Encounter (Signed)
Patient left message on medical records voicemail stating we were supposed to send his creatinine result to his urologist. Will check for request and call patient back at [phone].

## 2013-10-17 NOTE — Telephone Encounter (Signed)
Results were sent to patient urologist by the lab.

## 2013-10-18 ENCOUNTER — Other Ambulatory Visit: Payer: Self-pay

## 2013-10-18 DIAGNOSIS — M25529 Pain in unspecified elbow: Secondary | ICD-10-CM

## 2013-10-18 DIAGNOSIS — D649 Anemia, unspecified: Secondary | ICD-10-CM

## 2013-10-18 MED ORDER — HYDROCODONE-ACETAMINOPHEN 5-325 MG PO TABS: 1.0000 | ORAL_TABLET | Freq: Four times a day (QID) | ORAL | Status: DC | PRN

## 2013-10-18 NOTE — Telephone Encounter (Signed)
Pt was dx with a tumor/mass that is most likely malignant in his kidney's.  He is having a bladder scope tomorrow- he is very unhappy with Dr. Tammi Klippel. He is going to try to get an appt with a different surgeon at [place]. He is going to call and give [place] an update tomorrow. He has an increase in lower back pain. He states it is over his kidneys. He tried to have a massage and the area is sensitive to touch.  He needs a refill on his Hydrocodone in Dr. Carlota Raspberry absence sent to PA's. He usually gets a 2 week supply at a time. He needs to pick up on Friday.

## 2013-10-18 NOTE — Telephone Encounter (Signed)
Patient requested for Dr. Carlota Raspberry to call him at his convenience.

## 2013-10-19 ENCOUNTER — Other Ambulatory Visit (HOSPITAL_COMMUNITY): Payer: Self-pay | Admitting: Urology

## 2013-10-19 DIAGNOSIS — D49519 Neoplasm of unspecified behavior of unspecified kidney: Secondary | ICD-10-CM

## 2013-10-20 ENCOUNTER — Other Ambulatory Visit (INDEPENDENT_AMBULATORY_CARE_PROVIDER_SITE_OTHER): Payer: No Typology Code available for payment source | Admitting: Family Medicine

## 2013-10-20 DIAGNOSIS — D649 Anemia, unspecified: Secondary | ICD-10-CM

## 2013-10-24 NOTE — Telephone Encounter (Signed)
Pt advised.

## 2013-10-25 ENCOUNTER — Other Ambulatory Visit: Payer: Self-pay | Admitting: Family Medicine

## 2013-10-25 NOTE — Telephone Encounter (Signed)
Dr Carlota Raspberry, you wanted pt to recheck in 2-3 weeks. Has been 2 1/2 wks. Do you want to RF or have pt go ahead and RTC first?

## 2013-10-26 ENCOUNTER — Telehealth: Payer: Self-pay | Admitting: Family Medicine

## 2013-10-26 LAB — CRYOGLOBULIN

## 2013-10-26 NOTE — Telephone Encounter (Signed)
Med ready for pickup- see refill note. Should have follow up prior to this rx running out. Thanks.

## 2013-10-26 NOTE — Telephone Encounter (Signed)
Pt.notified

## 2013-10-27 ENCOUNTER — Ambulatory Visit (HOSPITAL_COMMUNITY)
Admission: RE | Admit: 2013-10-27 | Discharge: 2013-10-27 | Disposition: A | Discharge status: Home / Self Care | Payer: No Typology Code available for payment source | Source: Ambulatory Visit | Attending: Urology | Admitting: Urology

## 2013-10-27 DIAGNOSIS — D49519 Neoplasm of unspecified behavior of unspecified kidney: Secondary | ICD-10-CM

## 2013-10-30 ENCOUNTER — Encounter: Payer: Self-pay | Admitting: *Deleted

## 2013-10-30 ENCOUNTER — Telehealth: Payer: Self-pay

## 2013-10-30 ENCOUNTER — Other Ambulatory Visit (HOSPITAL_COMMUNITY): Payer: Self-pay | Admitting: Urology

## 2013-10-30 DIAGNOSIS — N2889 Other specified disorders of kidney and ureter: Secondary | ICD-10-CM

## 2013-10-30 NOTE — Telephone Encounter (Signed)
PT STATES SPOKE WITH SARAH HANSON REGARDING BLOODWORK, HE HAS A QUESTION REGARDING SENDING LABS TO ANOTHER DR

## 2013-10-31 ENCOUNTER — Ambulatory Visit (INDEPENDENT_AMBULATORY_CARE_PROVIDER_SITE_OTHER): Payer: No Typology Code available for payment source | Admitting: Cardiology

## 2013-10-31 ENCOUNTER — Encounter: Payer: Self-pay | Admitting: Cardiology

## 2013-10-31 ENCOUNTER — Ambulatory Visit (HOSPITAL_COMMUNITY)
Admission: RE | Admit: 2013-10-31 | Discharge: 2013-10-31 | Disposition: A | Discharge status: Home / Self Care | Payer: No Typology Code available for payment source | Source: Ambulatory Visit | Attending: Urology | Admitting: Urology

## 2013-10-31 VITALS — BP 151/88 | HR 109 | Ht 70.0 in | Wt 178.2 lb

## 2013-10-31 DIAGNOSIS — Q619 Cystic kidney disease, unspecified: Secondary | ICD-10-CM | POA: Insufficient documentation

## 2013-10-31 DIAGNOSIS — R002 Palpitations: Secondary | ICD-10-CM | POA: Insufficient documentation

## 2013-10-31 DIAGNOSIS — N2889 Other specified disorders of kidney and ureter: Secondary | ICD-10-CM

## 2013-10-31 NOTE — Patient Instructions (Signed)
The current medical regimen is effective;  continue present plan and medications.  Your physician has recommended that you wear a holter monitor. Holter monitors are medical devices that record the heart's electrical activity. Doctors most often use these monitors to diagnose arrhythmias. Arrhythmias are problems with the speed or rhythm of the heartbeat. The monitor is a small, portable device. You can wear one while you do your normal daily activities. This is usually used to diagnose what is causing palpitations/syncope (passing out).  Your physician has requested that you have an exercise tolerance test. For further information please visit [email or web address]. Please also follow instruction sheet, as given.  Further follow up will be based on these results.

## 2013-10-31 NOTE — Progress Notes (Signed)
HPI The patient presents for evaluation of palpitations. She has not had any prior cardiac history.   He says that this has been going on 4 about 6 or 7 weeks. He will feel his heart fluttering.  He has occasionally taken valium for this. He says this is happening daily. He alternatively describes it as a racing. It may last for 15-20 minutes or longer. He cannot bring it on. He does not have associated symptoms such as shortness of breath, PND or orthopnea. He does not have nausea vomiting or diaphoresis. He has no weight gain or edema. He otherwise is vigorously active walking routinely. He's had no problems this. He's recently had an extensive workup for some possible polymyalgia saw a rheumatologist.  He is having a work up for this.    Allergies  Allergen Reactions  . Cymbalta [Duloxetine Hcl] Other (See Comments)    Cranky and moods changes     Current Outpatient Prescriptions  Medication Sig Dispense Refill  . amitriptyline (ELAVIL) 25 MG tablet take 1 tablet by mouth at bedtime  90 tablet  1  . carisoprodol (SOMA) 350 MG tablet TAKE ONE TABLET BY MOUTH THREE TIMES DAILY AS NEEDED FOR MUSCLE SPASM   30 tablet  0  . HYDROcodone-acetaminophen (NORCO/VICODIN) 5-325 MG per tablet Take 1-2 tablets by mouth every 6 (six) hours as needed for moderate pain.  84 tablet  0  . lisinopril (PRINIVIL,ZESTRIL) 30 MG tablet Take 1 tablet (30 mg total) by mouth daily.  30 tablet  2  . omeprazole (PRILOSEC) 20 MG capsule Take 1 capsule (20 mg total) by mouth daily.  30 capsule  3  . pravastatin (PRAVACHOL) 20 MG tablet Take 1 tablet (20 mg total) by mouth daily.  90 tablet  1   No current facility-administered medications for this visit.    Past Medical History  Diagnosis Date  . Hypertension   . Hypercholesteremia   . Arthritis     Past Surgical History  Procedure Laterality Date  . Elbow surgery Right   . Shoulder surgery Left     x 3  . Cosmetic surgery    . Fracture surgery Left    wrist  . Vasectomy      Family History  Problem Relation Age of Onset  . Hyperlipidemia Mother   . Stroke Mother   . Diabetes Father   . Heart disease Father     MI  . Hyperlipidemia Father   . Hypertension Father   . Hypertension Mother   . Heart attack Maternal Grandfather   . Cancer Paternal Grandmother     unknoiwn type  . Heart attack Paternal Grandfather     History   Social History  . Marital Status: Significant Other    Spouse Name: N/A    Number of Children: 2  . Years of Education: N/A   Occupational History  . sales    Social History Main Topics  . Smoking status: Former Smoker    Types: Cigarettes    Quit date: 11/19/1979  . Smokeless tobacco: Never Used  . Alcohol Use: No  . Drug Use: No  . Sexual Activity: Not on file   Other Topics Concern  . Not on file   Social History Narrative  . No narrative on file    ROS:  As stated in the HPI and negative for all other systems.  PHYSICAL EXAM BP 151/88  Pulse 109  Ht 5\' 10"  (1.778 m)  Wt 178 lb  3.2 oz (80.831 kg)  BMI 25.57 kg/m2 GENERAL:  Well appearing HEENT:  Pupils equal round and reactive, fundi not visualized, oral mucosa unremarkable NECK:  No jugular venous distention, waveform within normal limits, carotid upstroke brisk and symmetric, no bruits, no thyromegaly LYMPHATICS:  No cervical, inguinal adenopathy LUNGS:  Clear to auscultation bilaterally BACK:  No CVA tenderness CHEST:  Unremarkable HEART:  PMI not displaced or sustained,S1 and S2 within normal limits, no S3, no S4, no clicks, no rubs, no murmurs ABD:  Flat, positive bowel sounds normal in frequency in pitch, no bruits, no rebound, no guarding, no midline pulsatile mass, no hepatomegaly, no splenomegaly EXT:  2 plus pulses throughout, no edema, no cyanosis no clubbing SKIN:  No rashes no nodules NEURO:  Cranial nerves II through XII grossly intact, motor grossly intact throughout PSYCH:  Cognitively intact, oriented to person  place and time  EKG:  Sinus rhythm, rate 106, LAD, intervals within normal limits, no acute ST-T wave changes.  10/31/2013  ASSESSMENT AND PLAN  PALPITATIONS:   He did have an episode of tachycardia while on the unit. I could not capture this on the EKG. He will wear a 24-hour monitor period we did discuss vagal maneuvers. Further evaluation will be based on these results.  APNEA:   He is snoring has been told he has The episodes. He will have a sleep study.

## 2013-11-01 ENCOUNTER — Encounter: Payer: Self-pay | Admitting: Family Medicine

## 2013-11-01 NOTE — Telephone Encounter (Signed)
Spoke with patient. Says Dr. Estanislado Pandy, his rheumatologist, had ordered lab work and wants it faxed to her office. Requested labs from Feb 2015-April 2015. Faxed with confirmation to Dr. Arlean Hopping office.

## 2013-11-02 ENCOUNTER — Ambulatory Visit (INDEPENDENT_AMBULATORY_CARE_PROVIDER_SITE_OTHER): Payer: No Typology Code available for payment source | Admitting: Family Medicine

## 2013-11-02 VITALS — BP 154/76 | HR 106 | Temp 98.3°F | Resp 18 | Ht 68.0 in | Wt 181.6 lb

## 2013-11-02 DIAGNOSIS — M25529 Pain in unspecified elbow: Secondary | ICD-10-CM

## 2013-11-02 DIAGNOSIS — D649 Anemia, unspecified: Secondary | ICD-10-CM

## 2013-11-02 DIAGNOSIS — M2550 Pain in unspecified joint: Secondary | ICD-10-CM

## 2013-11-02 DIAGNOSIS — Q619 Cystic kidney disease, unspecified: Secondary | ICD-10-CM

## 2013-11-02 DIAGNOSIS — I10 Essential (primary) hypertension: Secondary | ICD-10-CM

## 2013-11-02 DIAGNOSIS — G894 Chronic pain syndrome: Secondary | ICD-10-CM

## 2013-11-02 DIAGNOSIS — N281 Cyst of kidney, acquired: Secondary | ICD-10-CM

## 2013-11-02 MED ORDER — CARISOPRODOL 350 MG PO TABS: ORAL_TABLET | ORAL | Status: DC

## 2013-11-02 MED ORDER — HYDROCODONE-ACETAMINOPHEN 5-325 MG PO TABS: 1.0000 | ORAL_TABLET | Freq: Four times a day (QID) | ORAL | Status: DC | PRN

## 2013-11-02 NOTE — Progress Notes (Addendum)
This chart was scribed for Johnny Bryant. Johnny Raspberry, MD by Johnny Bryant, ED Scribe. This patient was seen in room 5 and the patient's care was started at 2:49 PM.  Subjective:    Patient ID: Johnny Bryant, male    DOB: 1951-05-14, 63 y.o.   MRN: [id number]   Chief Complaint  Patient presents with  . Follow-up    test reviews   . rx refills    norco     HPI Johnny Agreste, MD Johnny Bryant is a 63 y.o. male who's last office visit was 10/09/2013. Multiple concerns addressed at that visit. See notes for details.   1. Chronic pain polyarthralgia status post referral to Rheumatologist. I have been treating with Soma and Hydrocodone until determined cause of pain by specialist. Multiple rheum tests ordered last visit. Followed by Johnny Bryant. As of last office visit, takes approx 5 Hydrocodone and 3 Soma per day. Have discussed SNRI in the past but intolerant to effexor when he tried it feels palpitations started with this medicine. Last Soma prescription for #30 on 4/23. Last hydrocodone px #84 on 4/15. Pt reports his next appointment with Johnny Bryant in May. Pt reports he's taking 6 pain pills a day. He states that taking 1.5 pills instead of 2 helps him to manage his pain best. He does report he feels like his pain is more manageable and has been able to help move coffee.  2. Anemia with heme positive stool. Negative hemosure at last visit and per plan from GI no further workup if this was negative as suspected due to gastritis from NSAIDs which he is now off of. Pt denies any further blood in stool or abdominal pain.   3. Hematuria with normal PSA and creatinine.  [facility] by [facility] Johnny [facility]. Most recent visit note from 4/16 reviewed. He has a left cystic renal mass aproxx 5 cm as well as bilateral simple appearing cysts. There was concern for that note for possible kidney cancer and plan on further renal MRI for further evaluation, which is pending at this time. He reports he  was unable to have an MRI because of a piece of metal migrating from his left shoulder to his chest. Pt reports his brother runs [email or web address] and he is leaving here to pick up [place] and Cat Scan results to send to him. He states he has a resources through his brother to obtain second and third opinions. Pt reports he'd prefer another opinion outside of [place] either through [place] or [place] states he has an appointment with his Urologist at [facility] Monday, 4 days from now to talk about the tumor. Pt reports he is prepared for a partial nephrectomy. Pt states he hasn't told anyone yet about his high possibility of kidney cancer except his brother. He doesn't want to worry anyone until he knows more.   4. Palpitations noted at last office visit. Appeared to correlate with start of effexor. Has family history of CAD and risk facters of age HTN and HLD. Referred to cardiology, Johnny Bryant. Appointment was two days ago. Plan at that visit was to wear a 24 hr monitor and vagel maneuvers if needed. Also plan for sleep study to rule out OSA. Pt reports a good experience with his cardiologist. He states his BP has been hovering between 150-60. Pt does report some concerns about how his blood pressure medications will affect his recovery from potential renal cancer.   Pt states he  tried to contact Johnny Bryant since receiving news of his potential cancer from Johnny Bryant, but doesn't believe he's getting his messages. Posited email as a better solution than calling. Pt reports he's dealing with the news of his potential cancer very well. He states he feels healthy and motivated to do what he can to take care of himself and seek the medical advice and help he needs to fight. He states he feels glad to have caght it when he did.     Patient Active Problem List   Diagnosis Date Noted  . Palpitation 10/31/2013  . Sudden hearing loss, unspecified 09/28/2013  . Sensorineural hearing loss, unspecified  09/28/2013  . Chronic pain syndrome 08/18/2013  . Hematuria 08/18/2013  . Heme positive stool 08/18/2013    Past Medical History  Diagnosis Date  . Hypertension   . Hypercholesteremia   . Arthritis   . Chronic back pain     Past Surgical History  Procedure Laterality Date  . Elbow surgery Right   . Shoulder surgery Left     x 3  . Cosmetic surgery    . Fracture surgery Left     wrist  . Vasectomy      Allergies  Allergen Reactions  . Cymbalta [Duloxetine Hcl] Other (See Comments)    Cranky and moods changes     Prior to Admission medications   Medication Sig Start Date End Date Taking? Authorizing Provider  amitriptyline (ELAVIL) 25 MG tablet take 1 tablet by mouth at bedtime    Johnny Agreste, MD  carisoprodol (SOMA) 350 MG tablet TAKE ONE TABLET BY MOUTH THREE TIMES DAILY AS NEEDED FOR MUSCLE SPASM     Johnny Agreste, MD  HYDROcodone-acetaminophen (NORCO/VICODIN) 5-325 MG per tablet Take 1-2 tablets by mouth every 6 (six) hours as needed for moderate pain. 10/18/13   Chelle S Jeffery, PA-C  lisinopril (PRINIVIL,ZESTRIL) 30 MG tablet Take 1 tablet (30 mg total) by mouth daily. 10/09/13   Johnny Agreste, MD  omeprazole (PRILOSEC) 20 MG capsule Take 1 capsule (20 mg total) by mouth daily. 08/18/13   Johnny Agreste, MD  pravastatin (PRAVACHOL) 20 MG tablet Take 1 tablet (20 mg total) by mouth daily. 10/09/13   Johnny Agreste, MD    History   Social History  . Marital Status: Significant Other    Spouse Name: N/A    Number of Children: 2  . Years of Education: N/A   Occupational History  . sales    Social History Main Topics  . Smoking status: Former Smoker    Types: Cigarettes    Quit date: 11/19/1979  . Smokeless tobacco: Never Used  . Alcohol Use: No  . Drug Use: No  . Sexual Activity: Not on file   Other Topics Concern  . Not on file   Social History Narrative  . No narrative on file    Review of Systems  Gastrointestinal: Negative for  nausea, vomiting, abdominal pain, diarrhea and blood in stool.  Genitourinary: Positive for hematuria and flank pain.  Musculoskeletal: Positive for arthralgias and myalgias.  Psychiatric/Behavioral: Negative for dysphoric mood. The patient is not nervous/anxious.        Objective:   Physical Exam  Nursing note and vitals reviewed. Constitutional: He is oriented to person, place, and time. He appears well-developed and well-nourished.  HENT:  Head: Normocephalic and atraumatic.  Eyes: EOM are normal. Pupils are equal, round, and reactive to light.  Neck: Normal range of motion.  Neck supple. No JVD present. Carotid bruit is not present.  Cardiovascular: Normal rate, regular rhythm and normal heart sounds.   No murmur heard. Pulmonary/Chest: Effort normal and breath sounds normal. No respiratory distress. He has no wheezes. He has no rales.  Abdominal: Soft. Bowel sounds are normal. There is no tenderness. There is no CVA tenderness.  Musculoskeletal: He exhibits no edema.  Lymphadenopathy:    He has no cervical adenopathy.  Neurological: He is alert and oriented to person, place, and time. No cranial nerve deficit.  Skin: Skin is warm and dry.  Psychiatric: He has a normal mood and affect. His behavior is normal.     Triage Vitals: BP 154/76  Pulse 106  Temp(Src) 98.3 F (36.8 C) (Oral)  Resp 18  Ht 5\' 8"  (1.727 m)  Wt 181 lb 9.6 oz (82.373 kg)  BMI 27.62 kg/m2  SpO2 99%     Assessment & Plan:   Mishael Haran is a 63 y.o. male Renal cyst, hematuria - by reports, concerning for malignancy.  He is obtaining other opinions on next step and has urologist following him for this, but if he would like to see nephrologist at [facility], can check into this and refer.  I did discuss to keep follow up with Johnny Bryant to determine next step as unable to have MRI due to retained metal FB.   HTN (hypertension) - borderline, but no changes at this point as goal below 150/90.  Continue to  monitor outside of office.   Polyarthralgia, Chronic pain syndrome - Plan: carisoprodol (SOMA) 350 MG tablet, and hydrocodone refilled at same doses as prior. Has Rheuamtology follow up planned, and if no new treatments, may still need to be followed by pain management.   Anemia - with suspected NSAID induced gastritis - asx and heme negative last ov. No further workup at this point as long as remains asx.   Recheck in approx 1 month - has appt in June.  Can refill meds prior to that ov.   Meds ordered this encounter  Medications  . carisoprodol (SOMA) 350 MG tablet    Sig: TAKE ONE TABLET BY MOUTH THREE TIMES DAILY AS NEEDED FOR MUSCLE SPASM    Dispense:  60 tablet    Refill:  0    Can fill 11/04/13.  Marland Kitchen HYDROcodone-acetaminophen (NORCO/VICODIN) 5-325 MG per tablet    Sig: Take 1-2 tablets by mouth every 6 (six) hours as needed for moderate pain.    Dispense:  84 tablet    Refill:  0   Patient Instructions  Goal blood pressure - 150/90 or below. Keep a record of your blood pressures outside of the office visit, and keep follow up with cardiologist.  No changes in meds today - we should have more information after your rheumatology visit. If you would like me to refer you to a urologist or kidney specialist elsewhere - this can be done by MyChart as well.  Keep your appointment in June, and call me for med refills before that time.  Return to the clinic or go to the nearest emergency room if any of your symptoms worsen or new symptoms occur.    I personally performed the services described in this documentation, which was scribed in my presence. The recorded information has been reviewed and considered, and addended by me as needed.

## 2013-11-02 NOTE — Patient Instructions (Signed)
Goal blood pressure - 150/90 or below. Keep a record of your blood pressures outside of the office visit, and keep follow up with cardiologist.  No changes in meds today - we should have more information after your rheumatology visit. If you would like me to refer you to a urologist or kidney specialist elsewhere - this can be done by MyChart as well.  Keep your appointment in June, and call me for med refills before that time.  Return to the clinic or go to the nearest emergency room if any of your symptoms worsen or new symptoms occur.

## 2013-11-06 ENCOUNTER — Ambulatory Visit: Payer: No Typology Code available for payment source | Admitting: Family Medicine

## 2013-11-07 ENCOUNTER — Telehealth: Payer: Self-pay

## 2013-11-07 DIAGNOSIS — Z23 Encounter for immunization: Secondary | ICD-10-CM

## 2013-11-07 MED ORDER — ZOSTER VACCINE LIVE 19400 UNT/0.65ML ~~LOC~~ SOLR: 0.6500 mL | Freq: Once | SUBCUTANEOUS | Status: DC

## 2013-11-07 NOTE — Telephone Encounter (Signed)
Rx provided for shingles shot. Ready for pickup as will need to be one at his pharmacy.  Can return for procedure only visit for pneumovax. Lab only order in.

## 2013-11-07 NOTE — Telephone Encounter (Signed)
Spoke to patient.  Advised him of Dr. Vonna Kotyk message.  He verbalized understanding and stated he would pickup his RX today.

## 2013-11-07 NOTE — Telephone Encounter (Signed)
GREENE -  Pt just saw his rheumatologist and they want him to have a preventive shingles shot and pneumonia.  He wants to know if we could go ahead and write a prescription for the shingles shot so that when he comes in he could get them both at the same time.  Also wants to know if he has to see the doctor or if it could just be listed as an injection only visit.   [phone]

## 2013-11-08 NOTE — Telephone Encounter (Signed)
Shingles Rx in drawer

## 2013-11-09 ENCOUNTER — Ambulatory Visit (HOSPITAL_COMMUNITY)
Admission: RE | Admit: 2013-11-09 | Discharge: 2013-11-09 | Disposition: A | Discharge status: Home / Self Care | Payer: No Typology Code available for payment source | Source: Ambulatory Visit | Attending: Rheumatology | Admitting: Rheumatology

## 2013-11-09 ENCOUNTER — Other Ambulatory Visit (HOSPITAL_COMMUNITY): Payer: Self-pay | Admitting: Rheumatology

## 2013-11-09 ENCOUNTER — Other Ambulatory Visit (HOSPITAL_COMMUNITY): Payer: Self-pay | Admitting: Urology

## 2013-11-09 ENCOUNTER — Encounter: Payer: Self-pay | Admitting: *Deleted

## 2013-11-09 DIAGNOSIS — N2889 Other specified disorders of kidney and ureter: Secondary | ICD-10-CM | POA: Insufficient documentation

## 2013-11-09 DIAGNOSIS — R079 Chest pain, unspecified: Secondary | ICD-10-CM | POA: Insufficient documentation

## 2013-11-09 DIAGNOSIS — R52 Pain, unspecified: Secondary | ICD-10-CM

## 2013-11-10 ENCOUNTER — Ambulatory Visit (HOSPITAL_COMMUNITY)
Admission: RE | Admit: 2013-11-10 | Discharge: 2013-11-10 | Disposition: A | Discharge status: Home / Self Care | Payer: No Typology Code available for payment source | Source: Ambulatory Visit | Attending: Internal Medicine | Admitting: Internal Medicine

## 2013-11-10 DIAGNOSIS — R002 Palpitations: Secondary | ICD-10-CM

## 2013-11-10 DIAGNOSIS — R9439 Abnormal result of other cardiovascular function study: Secondary | ICD-10-CM | POA: Insufficient documentation

## 2013-11-10 DIAGNOSIS — I4949 Other premature depolarization: Secondary | ICD-10-CM | POA: Insufficient documentation

## 2013-11-10 DIAGNOSIS — R000 Tachycardia, unspecified: Secondary | ICD-10-CM

## 2013-11-13 ENCOUNTER — Encounter (INDEPENDENT_AMBULATORY_CARE_PROVIDER_SITE_OTHER): Payer: No Typology Code available for payment source

## 2013-11-13 ENCOUNTER — Encounter: Payer: Self-pay | Admitting: *Deleted

## 2013-11-13 DIAGNOSIS — R002 Palpitations: Secondary | ICD-10-CM

## 2013-11-13 NOTE — Progress Notes (Signed)
Patient ID: Johnny Bryant, male   DOB: 1950-12-30, 63 y.o.   MRN: [id number] E--Cardio 24 holter monitor applied to patient.

## 2013-11-14 ENCOUNTER — Other Ambulatory Visit (HOSPITAL_COMMUNITY): Payer: Self-pay | Admitting: Nephrology

## 2013-11-14 DIAGNOSIS — I776 Arteritis, unspecified: Secondary | ICD-10-CM

## 2013-11-15 ENCOUNTER — Encounter (HOSPITAL_COMMUNITY): Payer: Self-pay | Admitting: Pharmacy Technician

## 2013-11-16 ENCOUNTER — Other Ambulatory Visit: Payer: Self-pay | Admitting: Radiology

## 2013-11-16 ENCOUNTER — Telehealth: Payer: Self-pay

## 2013-11-16 DIAGNOSIS — D649 Anemia, unspecified: Secondary | ICD-10-CM

## 2013-11-16 DIAGNOSIS — M25529 Pain in unspecified elbow: Secondary | ICD-10-CM

## 2013-11-16 NOTE — Telephone Encounter (Signed)
Johnny Bryant is a Pt of Dr. Carlota Raspberry, and is in need of a refill on Vicodin. Please call  [phone]

## 2013-11-17 ENCOUNTER — Telehealth: Payer: Self-pay

## 2013-11-17 NOTE — Telephone Encounter (Signed)
Pt called to schedule appt with Dr Carlota Raspberry asap. Dr Vonna Kotyk next available is in July. Pt scheduled appt but asked to leave message for dr Carlota Raspberry to call him.   Pt would like to speak with him regarding his various specialists and cancer treatment plan.   Please call pt.   Bf

## 2013-11-17 NOTE — Telephone Encounter (Signed)
Winson called once more and is wondering about his vicodin rx refill. Patient came to pick up a rx today thinking it was his vicodin but it wasn't. Please return call and advise as patient states he needs this refilled now. Patient states he would appreciate if this could be done today. Thank you.

## 2013-11-17 NOTE — Telephone Encounter (Signed)
Johnny Bryant, patient would like for you to call him.   [phone]

## 2013-11-18 MED ORDER — HYDROCODONE-ACETAMINOPHEN 5-325 MG PO TABS: 1.0000 | ORAL_TABLET | Freq: Four times a day (QID) | ORAL | Status: DC | PRN

## 2013-11-18 NOTE — Telephone Encounter (Signed)
I was not in the office yesterday.  Refilled today and ready for pickup.

## 2013-11-18 NOTE — Telephone Encounter (Signed)
Pt notified that rx is up front for p/u 

## 2013-11-20 NOTE — Telephone Encounter (Signed)
Pt will be in today for his PN shot.

## 2013-11-21 ENCOUNTER — Encounter: Payer: Self-pay | Admitting: Family Medicine

## 2013-11-21 ENCOUNTER — Ambulatory Visit (INDEPENDENT_AMBULATORY_CARE_PROVIDER_SITE_OTHER): Payer: No Typology Code available for payment source | Admitting: *Deleted

## 2013-11-21 DIAGNOSIS — Z23 Encounter for immunization: Secondary | ICD-10-CM

## 2013-11-21 NOTE — Addendum Note (Signed)
Addended by: Vear Clock on: 11/21/2013 12:02 PM   Modules accepted: Orders

## 2013-11-21 NOTE — Progress Notes (Unsigned)
   Subjective:    Patient ID: Johnny Bryant, male    DOB: 1951/04/02, 63 y.o.   MRN: [id number]  HPI Patient here for pneumonia vaccine only. Patient brought order in on 11/20/2013 and Dr Carlota Raspberry ok'd it.    Review of Systems     Objective:   Physical Exam        Assessment & Plan:

## 2013-11-22 ENCOUNTER — Ambulatory Visit (HOSPITAL_COMMUNITY): Admission: RE | Admit: 2013-11-22 | Payer: No Typology Code available for payment source | Source: Ambulatory Visit

## 2013-11-24 ENCOUNTER — Other Ambulatory Visit: Payer: Self-pay | Admitting: Family Medicine

## 2013-11-24 NOTE — Telephone Encounter (Signed)
Noted. Refilled.

## 2013-11-25 ENCOUNTER — Other Ambulatory Visit: Payer: Self-pay | Admitting: Family Medicine

## 2013-11-25 DIAGNOSIS — I10 Essential (primary) hypertension: Secondary | ICD-10-CM

## 2013-11-25 MED ORDER — HYDROCHLOROTHIAZIDE 12.5 MG PO CAPS: 12.5000 mg | ORAL_CAPSULE | Freq: Every day | ORAL | Status: DC

## 2013-11-27 NOTE — Telephone Encounter (Signed)
Called in.

## 2013-11-29 ENCOUNTER — Telehealth: Payer: Self-pay

## 2013-11-29 DIAGNOSIS — M25529 Pain in unspecified elbow: Secondary | ICD-10-CM

## 2013-11-29 DIAGNOSIS — D649 Anemia, unspecified: Secondary | ICD-10-CM

## 2013-11-29 NOTE — Telephone Encounter (Signed)
Patient requests a refill for his vicodin.

## 2013-11-30 MED ORDER — HYDROCODONE-ACETAMINOPHEN 5-325 MG PO TABS: 1.0000 | ORAL_TABLET | Freq: Four times a day (QID) | ORAL | Status: DC | PRN

## 2013-11-30 NOTE — Telephone Encounter (Signed)
Left message on machine to call back Rx is up front for him to p/u

## 2013-11-30 NOTE — Telephone Encounter (Signed)
Last filled 11/18/13 #84, which should last 2 weeks.  New rx ready for pickup, can be filled tomorrow. Did he see rheumatologist recently? Need to determine if other medications/treatments were started, or if not, will need to start referral to pain management as discussed prior. Rx ready.

## 2013-12-04 ENCOUNTER — Other Ambulatory Visit: Payer: Self-pay | Admitting: Family Medicine

## 2013-12-06 ENCOUNTER — Telehealth: Payer: Self-pay

## 2013-12-06 NOTE — Telephone Encounter (Signed)
Pt requesting referral to phychiatrist  Best phone for pt is [phone]

## 2013-12-06 NOTE — Telephone Encounter (Signed)
Pt states he would like to be referred to a psychiatrist    Best phone [phone]

## 2013-12-08 NOTE — Telephone Encounter (Signed)
Called pt to obtain more info about request below. Left message on VM to call back for more info. Did he want to be referred to therapist/counselor? Or psychiatrist that would prescribe medication? Also - for what reason? I can talk to him more on the phone this weekend if needed to discuss further.

## 2013-12-09 ENCOUNTER — Encounter: Payer: Self-pay | Admitting: Family Medicine

## 2013-12-09 NOTE — Telephone Encounter (Signed)
Received call from Mr Johnny Bryant:  Johnny Bryant as did not want to wait in waiting room due to medicines he is taking and concern about catching illness from others.  Has appt with Dr. [name] at [facility] for kidney surgery on 11th of June. Being treated for vasculitis by Dr. Estanislado Pandy, and referred to Dr. Justin Mend - nephrologist, planning on renal bx.  Dialysis was mentioned. Started on methotrexate.  Would like to talk to mental health professional for all the stress that he is having with all the new diagnoses. Still exercising by walking with dogs each day.  Denies suicidal thoughts.   Will provide email with names of counselors and if not able to see them in upcoming week to 10 days - advised to rtc to talk to me in interim.  If any suicidal thoughts - call 911/go to ER - understanding expressed.

## 2013-12-11 ENCOUNTER — Other Ambulatory Visit: Payer: Self-pay

## 2013-12-11 MED ORDER — OMEPRAZOLE 20 MG PO CPDR: DELAYED_RELEASE_CAPSULE | ORAL | Status: DC

## 2013-12-13 ENCOUNTER — Telehealth: Payer: Self-pay

## 2013-12-13 DIAGNOSIS — D649 Anemia, unspecified: Secondary | ICD-10-CM

## 2013-12-13 DIAGNOSIS — M25529 Pain in unspecified elbow: Secondary | ICD-10-CM

## 2013-12-13 NOTE — Telephone Encounter (Signed)
Due to the busy office today, I did not note this message until tonight, and due to the present time did not call him this late. Please call in the morning and see if there is something particular of concern prior to his surgery appt tomorrow that we can help him with.  I am in the office tomorrow morning if acutely needs to talk.  We can try to request the labs from Dr. Estanislado Pandy, but they were likely already sent to me and can look for them tomorrow.  Will refill hydrocodone.  I know he is seeing lots of other specialists at this point, but would like to have OV with him prior to this hydrocodone rx running out to determine plan for pain management.  Will try to touch base with Dr. Estanislado Pandy prior to that time, and should know more about other possible surgeries/procedures by then as well.

## 2013-12-13 NOTE — Telephone Encounter (Signed)
Raun called in and would like Dr. Carlota Raspberry to.Marland KitchenMarland KitchenGive him a call today to talk briefly before his surgery tomorrow                                                                            Refill his Vicodin                                 Dr Kathi Ludwig said his lab results looked ok but his sodium is low    He can be reached at [phone] Thank you

## 2013-12-14 ENCOUNTER — Other Ambulatory Visit: Payer: Self-pay | Admitting: Family Medicine

## 2013-12-14 MED ORDER — HYDROCODONE-ACETAMINOPHEN 5-325 MG PO TABS: 1.0000 | ORAL_TABLET | Freq: Four times a day (QID) | ORAL | Status: DC | PRN

## 2013-12-14 NOTE — Telephone Encounter (Signed)
Refilled hydrocodone and ready for pickup.  Called provided cell phone number. Left message to call back if needed with information below.  I will be out of office next few days, so if urgent need, may need to discuss with one of my colleagues. Thanks.

## 2013-12-14 NOTE — Telephone Encounter (Signed)
LMOM for pt to make sure he knows that his Rx is ready to p/up.

## 2013-12-20 ENCOUNTER — Telehealth: Payer: Self-pay

## 2013-12-20 MED ORDER — CARISOPRODOL 350 MG PO TABS: ORAL_TABLET | ORAL | Status: DC

## 2013-12-20 NOTE — Telephone Encounter (Signed)
Rx faxed

## 2013-12-20 NOTE — Telephone Encounter (Signed)
Dr.Greene, Pt calling to demand that you fit him into your appt schedule this upcoming Monday ( 12/25/13), he states that he needs to relay some urgent info to you, pt would not provide any further details. Best# [phone]

## 2013-12-20 NOTE — Telephone Encounter (Signed)
Saw rheum and [facility] specialist this week. Dr. Diamantina Monks wanted to do bx for vasculitis on his kidney--scheduled appt for bx of left kidney but because it was supposed to be his right kidney, not left he canceled.  Now he doesn't want bx without 2nd opinion. Wants to know if we can refer him to another rheum.   BP 160-170/90--no difference with HCT  Methotrexate making pt sick and he's having muscle spasms and cramps everywhere randomly (hands, feet, calves). Wants to know how to deal with methotrexate SE. He was told to take Mg and Ca--not helping with cramps. Also needs RF of Soma--wants to see if it will help with the muscle spasms.   Wants 15 min appt on Monday if possible

## 2013-12-20 NOTE — Telephone Encounter (Signed)
rx refilled on Dr. Vonna Kotyk behalf  Meds ordered this encounter  Medications  . carisoprodol (SOMA) 350 MG tablet    Sig: TAKE ONE TABLET BY MOUTH THREE TIMES DAILY AS NEEDED FOR MUSCLE SPASM    Dispense:  60 tablet    Refill:  0    Order Specific Question:  Supervising Provider    Answer:  DOOLITTLE, ROBERT P [[id number]]

## 2013-12-21 ENCOUNTER — Encounter: Payer: Self-pay | Admitting: *Deleted

## 2013-12-21 DIAGNOSIS — N2889 Other specified disorders of kidney and ureter: Secondary | ICD-10-CM

## 2013-12-21 NOTE — Assessment & Plan Note (Signed)
12/14/2013 note from [place] [facility] Frances Furbish MD. Left renal mass - Bosniak 4 left lower pole renal cystic lesion and recommendation for performing partial nephrectomy. Surgery within 3 months.

## 2013-12-21 NOTE — Telephone Encounter (Signed)
If he is having those side effects to Methotrexate, should discuss with rheumatologist tomorrow as they prescribed this medicine and not a medicine I typically prescribe.  Would call them tomorrow (Thursday 6/18) in order to determine what to do next.  If he can not reach them tomorrow, or does not want to discuss with rheumatologist, then recommend coming into [facility] to be seen to determine how to treat these symptoms and make sure other testing does not need to be done. I am not sure of the details of his kidney biopsy and we can discuss another rheumatologist if needed, but would like to discuss this further in office and address his concerns in more detail. Can also recheck BP and adjust meds then as well.   OK to try Manuela Neptune (appreciate this being refilled as I am out of the office), but methotrexate side effects appear most acute and these need to be evaluated.   I know he has a lot going on right now.  Did he get in touch with a counselor/therapist?  I would be happy to see him Monday. I do not have availability in appts - unfortunately I am already overbooked, but I am seeing patient's at 102 Monday morning, and he can walk in to see me then.  I am out of the office the next 4 days, but he can certainly see one of the other providers sooner as above if needed.

## 2013-12-22 ENCOUNTER — Telehealth: Payer: Self-pay | Admitting: *Deleted

## 2013-12-22 NOTE — Telephone Encounter (Signed)
Lm for rtn call 

## 2013-12-22 NOTE — Telephone Encounter (Signed)
Patient returning call to [facility]. She said she will call him back she was dealing with an emergency.

## 2013-12-22 NOTE — Telephone Encounter (Signed)
PT  AWARE OF MONITOR RESULTS  PER  DR  HOCHREIN    RARE  ATRIAL TACHYCARDIA WITH RUNS./CY

## 2013-12-22 NOTE — Telephone Encounter (Signed)
Can you please call Johnny Bryant again. He missed that call. Thanks

## 2013-12-24 NOTE — Telephone Encounter (Signed)
Reviewed notes below. I will look at my schedule again tomorrow morning to determine if there are any cancellations in the afternoon as he does not want to walk in to be seen in the morning. Walking into 102 would have possibly been better to have time to sort out some of the concerns on his previous message that can be difficult in a scheduled follow up appointment time block. As far as scheduling appointments only, as I have appt's only 1/2 day a week, this can be difficult at times.  This is why I have seen him in at the walk in center, especially initially. But if he would like a provider who has more scheduled office hours - can look into other options at our office or other primary care practice. Again, will look to see if there are cancellations tomorrow and other options.

## 2013-12-24 NOTE — Telephone Encounter (Signed)
Pt is frustrated with the amount of time it has taken to get an answer for his question.  He is also frustrated that he has to wait and cannot make an appt when he needs one - he is not willing to come to 102 even with the offer of a pager for his car.  I explained that most of our care is by walk-in.  He states that is not what he wants and he needs to have a PCP who will see him when he needs to have a visit by appt.  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

## 2013-12-25 ENCOUNTER — Encounter: Payer: Self-pay | Admitting: Family Medicine

## 2013-12-25 ENCOUNTER — Ambulatory Visit (INDEPENDENT_AMBULATORY_CARE_PROVIDER_SITE_OTHER): Payer: No Typology Code available for payment source | Admitting: Family Medicine

## 2013-12-25 VITALS — BP 163/79 | HR 122 | Temp 98.5°F | Resp 18 | Ht 69.0 in | Wt 176.4 lb

## 2013-12-25 DIAGNOSIS — F439 Reaction to severe stress, unspecified: Secondary | ICD-10-CM

## 2013-12-25 DIAGNOSIS — N289 Disorder of kidney and ureter, unspecified: Secondary | ICD-10-CM

## 2013-12-25 DIAGNOSIS — I10 Essential (primary) hypertension: Secondary | ICD-10-CM

## 2013-12-25 DIAGNOSIS — Z733 Stress, not elsewhere classified: Secondary | ICD-10-CM

## 2013-12-25 DIAGNOSIS — M2550 Pain in unspecified joint: Secondary | ICD-10-CM

## 2013-12-25 DIAGNOSIS — N281 Cyst of kidney, acquired: Secondary | ICD-10-CM

## 2013-12-25 DIAGNOSIS — I776 Arteritis, unspecified: Secondary | ICD-10-CM | POA: Insufficient documentation

## 2013-12-25 DIAGNOSIS — E785 Hyperlipidemia, unspecified: Secondary | ICD-10-CM

## 2013-12-25 DIAGNOSIS — Q619 Cystic kidney disease, unspecified: Secondary | ICD-10-CM

## 2013-12-25 DIAGNOSIS — E871 Hypo-osmolality and hyponatremia: Secondary | ICD-10-CM

## 2013-12-25 DIAGNOSIS — N2889 Other specified disorders of kidney and ureter: Secondary | ICD-10-CM

## 2013-12-25 MED ORDER — HYDROCHLOROTHIAZIDE 12.5 MG PO CAPS: 12.5000 mg | ORAL_CAPSULE | Freq: Every day | ORAL | Status: DC

## 2013-12-25 MED ORDER — LISINOPRIL 40 MG PO TABS: 40.0000 mg | ORAL_TABLET | Freq: Every day | ORAL | Status: DC

## 2013-12-25 MED ORDER — PRAVASTATIN SODIUM 20 MG PO TABS: 20.0000 mg | ORAL_TABLET | Freq: Every day | ORAL | Status: DC

## 2013-12-25 NOTE — Progress Notes (Signed)
Subjective:     Patient ID: Johnny Bryant, male    DOB: 1951/02/17, 63 y.o.   MRN: [id number]  HPI Johnny Bryant is a 63 y.o. male  Here as a work in appointment - see multiple recent telephone notes.  Last ov with me 11/02/13.   Complicated recent medical history as below.  Renal: Justin Mend , Dr. [name] at [facility] Rheum: Deveshwar Urology: Tresa Moore   Referred to rheumatology for chronic pain and polyarthralgias, and diffuse muscle cramps. I have been prescribing hydrocodone and Soma until underlying cause determined with possible transition to pain mgt if continued to require these medications.  Followed by Dr. Estanislado Pandy.  positive P-anca, myeloperoxidase antibody, and elevated CRP and ESR. Synovitis of multiple joints. Underlying hematuria, and by Dr. Arlean Hopping  notes, had been discussed with nephrologist of probable vasculitis, and Raynaud's.  Started on prednisone 20 mg 11/07/13 with tapering up to $Rem'20mg'mhBC$  qd, and also started on methotrexate (up to 6 tablets Q week), and folic acid $RemoveBe'2mg'qROQOFIKr$  qd. States joint pain has improved with Prednisone. However, pt is still taking about 4-5 Hydrocodone daily for improvement of pain. Pneumovax updated. Plan for standing order labs at rheumatology every 2 weeks x 3, then every 2 months. Last eval 12/09/13 - on prednisone $RemoveBefor'20mg'xTRjZuzHacnL$  qd at that time. Per her notes, had planned on 2nd opinion with rheumatologist at [facility]. Advised to take vit c and magnesium, and increased MTX to 8 tablets each week, then labs Q 3 weeks. Plan on recheck in 2 month, but per notes - she would like input of Dr. Justin Mend and and [facility]. Next full blood panel with Dr. Estanislado Pandy in 1 week. Pt states he would like to find another Rheumotologist as he would like a second opinion regarding some of the medications he is taking at this time. Pt states he doesn't have any issues with Dr. Estanislado Pandy, however, he feels he needs some confirmation regarding his symptoms and current medication plan.  Renal mass - left  cystic renal mass noted on CT urogram 10/2013 after hematuria eval at [facility]- Dr. Tresa Moore. Suspected renal cancer. Unable to have MRI d/t metal in chest.  Ultrasound - B3 at least. initial plan of possible partial nephrectomy.  bilateral renal cysts also present.  Second opinion through [facility]. Dr. [name]. Planned on possible renal biopsy, but planned on R kidney instead of left biopsy d/t cyst on L kidney to be resected?  Pt states 3 kidney specialist have looked at his results and says all 3 providers have assured him 80% that cysts are malignant. However, states R kidney should be biopsied instead of the L kidney per Dr. [name]. Dr. [name] wishes to plan a repeat CT scan in August or September of this year. A partial nephrectomy is planned after scans.  Hyponatremia - 132 on 12/09/13 labs at rheum. Next labwork planned for   HTN - takes $Remov'30mg'aqMMXB$  lisinopril and hctz 12.$RemoveBef'5mg'oQBZupVaSE$  qd. See telephone notes - still some elevated readings out of office. Outside labs on 6/6/20105 show Creatinine 0.92 and a EGFR of 88  Situational stress with above - see telephone note/emails. Requested numbers for therapists.   Pt is requesting 90 day supply of Pravachol 20 mg, Amitriptyline 25 mg, Lisinopril 30 mg, and HCTZ 12.5 mg  Blood Pressure- States his blood pressure readings have been consistent with readings around 163/79  Pt states he is doing well overall mentally. States "i am trying not to be depressed". However, pt states he is interested in talking  to someone regarding his current health status and the emotions he is currently dealing with.  Patient Active Problem List   Diagnosis Date Noted  . Left renal mass 11/09/2013  . Palpitation 10/31/2013  . Sudden hearing loss, unspecified 09/28/2013  . Sensorineural hearing loss, unspecified 09/28/2013  . Chronic pain syndrome 08/18/2013  . Hematuria 08/18/2013  . Heme positive stool 08/18/2013   Past Medical History  Diagnosis Date  . Hypertension   .  Hypercholesteremia   . Arthritis   . Chronic back pain    Past Surgical History  Procedure Laterality Date  . Elbow surgery Right   . Shoulder surgery Left     x 3  . Cosmetic surgery    . Fracture surgery Left     wrist  . Vasectomy     Allergies  Allergen Reactions  . Cymbalta [Duloxetine Hcl] Other (See Comments)    Cranky and moods changes    Prior to Admission medications   Medication Sig Start Date End Date Taking? Authorizing Provider  amitriptyline (ELAVIL) 25 MG tablet Take 25 mg by mouth at bedtime.   Yes Historical Provider, MD  b complex vitamins tablet Take 1 tablet by mouth daily.   Yes Historical Provider, MD  Calcium Carbonate-Vitamin D (CALCIUM 500 + D) 500-125 MG-UNIT TABS Take by mouth.   Yes Historical Provider, MD  carisoprodol (SOMA) 350 MG tablet TAKE ONE TABLET BY MOUTH THREE TIMES DAILY AS NEEDED FOR MUSCLE SPASM 12/20/13  Yes Chelle S Jeffery, PA-C  hydrochlorothiazide (MICROZIDE) 12.5 MG capsule Take 1 capsule (12.5 mg total) by mouth daily. 11/25/13  Yes [name], MD  HYDROcodone-acetaminophen (NORCO/VICODIN) 5-325 MG per tablet Take 1-2 tablets by mouth every 6 (six) hours as needed for moderate pain. 12/14/13  Yes [name], MD  lisinopril (PRINIVIL,ZESTRIL) 30 MG tablet Take 1 tablet (30 mg total) by mouth daily. 10/09/13  Yes [name], MD  Magnesium Ascorbate POWD by Does not apply route.   Yes Historical Provider, MD  methotrexate 2.5 MG tablet Take 20 mg by mouth once a week.   Yes Historical Provider, MD  Misc Natural Products (TURMERIC CURCUMIN) CAPS Take 1 capsule by mouth daily.   Yes Historical Provider, MD  Multiple Vitamin (MULTIVITAMIN WITH MINERALS) TABS tablet Take 1 tablet by mouth daily.   Yes Historical Provider, MD  omeprazole (PRILOSEC) 20 MG capsule take 1 capsule by mouth once daily 12/11/13  Yes [name], MD  pravastatin (PRAVACHOL) 20 MG tablet Take 1 tablet (20 mg total) by mouth daily. 10/09/13  Yes [name], MD  predniSONE (DELTASONE) 20 MG tablet Take 40 mg by mouth daily with breakfast.   Yes Historical Provider, MD   History   Social History  . Marital Status: Significant Other    Spouse Name: N/A    Number of Children: 2  . Years of Education: N/A   Occupational History  . sales    Social History Main Topics  . Smoking status: Former Smoker    Types: Cigarettes    Quit date: 11/19/1979  . Smokeless tobacco: Never Used  . Alcohol Use: No  . Drug Use: No  . Sexual Activity: Not on file   Other Topics Concern  . Not on file   Social History Narrative  . No narrative on file      Review of Systems  Genitourinary: Negative for difficulty urinating.  Musculoskeletal: Positive for arthralgias.  Skin: Positive for rash.  other as in HPI   Objective:  Physical Exam  Vitals reviewed. Constitutional: He is oriented to person, place, and time. He appears well-developed and well-nourished.  HENT:  Head: Normocephalic and atraumatic.  Eyes: EOM are normal. Pupils are equal, round, and reactive to light.  Neck: No JVD present. Carotid bruit is not present.  Cardiovascular: Normal rate, regular rhythm and normal heart sounds.   No murmur heard. Pulmonary/Chest: Effort normal and breath sounds normal. He has no rales.  Musculoskeletal: He exhibits no edema.  Neurological: He is alert and oriented to person, place, and time.  Skin: Skin is warm and dry.  Diffuse lacelike, erythematous patchy rash on extremities.   Psychiatric: He has a normal mood and affect. His speech is normal and behavior is normal. Judgment and thought content normal. Cognition and memory are normal. He expresses no suicidal ideation.    Filed Vitals:   12/25/13 1539  BP: 163/79  Pulse: 122  Temp: 98.5 F (36.9 C)  TempSrc: Oral  Resp: 18  Height: $Remove'5\' 9"'OgPCuLv$  (1.753 m)  Weight: 176 lb 6.4 oz (80.015 kg)  SpO2: 98%    Assessment & Plan:   Johnny Bryant is a 63 y.o. male Essential  hypertension - Plan: lisinopril (PRINIVIL,ZESTRIL) 40 MG tablet, hydrochlorothiazide (MICROZIDE) 12.5 MG capsule  - still somewhat elevated - incr lisinopril to $RemoveBefor'40mg'GSJtlRwXZouZ$  qd.  Check home BP's.  has upcoming labs to recheck hyponatremia with other provider, so deferred bloodwork in office today.   Other and unspecified hyperlipidemia - Plan: pravastatin (PRAVACHOL) 20 MG tablet  -Last lfts ok.  Cont to monitor with MTX> plan on fasting lipids next few months.    Vasculitis, Polyarthralgia - Plan: Ambulatory referral to Rheumatology - currently on MTX with increasing regimen, and prednisone. At his request will refer to other rheumatologist for 2nd opinion, but under care of Dr. Estanislado Pandy. Renal biopsy for vasculitis deferred at present.   Renal cyst, Left renal mass, - suspected renal tumor/CA. followed by Dr. [name] at [facility]. Planning on repeat scanning in August/Spetember to determine course at that time.   Situational stress - has phone numbers for counseling.  Encouraged to follow through with this. If medication needed that is also an option, and can discuss with me further.    Hyponatremia  - repeat labs pending. If persists, may need to look at most recent med for cause, or combo with prior regimen.   Discussed concerns of appt times and that I have appts 1/2 day per week only, but other times can walk in to be seen anytime I am at 102. See prior phone notes.  Understand his concern of being in waiting room while on immune suppressant meds.  He has scheduled once per month appts for now and can always cancel these if not needed, but also discussed other providers in our office have more scheduled appt times if he would like to transfer to them or other office with increased appt time availability. He would like to continue having me as PCP at this time, and hopefully appts above will suffice.     Meds ordered this encounter                         . lisinopril (PRINIVIL,ZESTRIL) 40 MG  tablet    Sig: Take 1 tablet (40 mg total) by mouth daily.    Dispense:  90 tablet    Refill:  1  . pravastatin (PRAVACHOL) 20 MG tablet  Sig: Take 1 tablet (20 mg total) by mouth daily.    Dispense:  90 tablet    Refill:  1  . hydrochlorothiazide (MICROZIDE) 12.5 MG capsule    Sig: Take 1 capsule (12.5 mg total) by mouth daily.    Dispense:  90 capsule    Refill:  1   Patient Instructions  I will refer you to other rheumatologist as discussed.  [facility]  Call counselor as discussed.  If sodium still low on labs next week - may need to look at medications as cause. Increased lisinopril to $RemoveBefor'40mg'oyrIeAeLepqd$       I personally performed the services described in this documentation, which was scribed in my presence. The recorded information has been reviewed and considered.

## 2013-12-25 NOTE — Telephone Encounter (Signed)
Had once cancellation this afternoon for 15 minute slot. Had Johnny Bryant call pt and arrange for him to take that appt, but to advise this was a shortened time slot to address most pressing concerns today.

## 2013-12-25 NOTE — Patient Instructions (Addendum)
I will refer you to other rheumatologist as discussed.  [facility].  Call counselor as discussed.  If sodium still low on labs next week - may need to look at medications as cause. Increased lisinopril to 40mg 

## 2013-12-29 ENCOUNTER — Other Ambulatory Visit: Payer: Self-pay

## 2013-12-29 DIAGNOSIS — E785 Hyperlipidemia, unspecified: Secondary | ICD-10-CM

## 2013-12-29 DIAGNOSIS — G894 Chronic pain syndrome: Secondary | ICD-10-CM

## 2013-12-29 DIAGNOSIS — I10 Essential (primary) hypertension: Secondary | ICD-10-CM

## 2013-12-29 NOTE — Telephone Encounter (Signed)
Pt needs refill on Vicodin, but also wants to have 90 day supple refill on Hydrochlorothiazide, Lisinopril , Amitriptyline, Pravastatin Sodium.

## 2013-12-30 MED ORDER — HYDROCODONE-ACETAMINOPHEN 5-325 MG PO TABS: 1.0000 | ORAL_TABLET | Freq: Four times a day (QID) | ORAL | Status: DC | PRN

## 2013-12-30 NOTE — Telephone Encounter (Signed)
The patient called again to say thank you,  that he did receive his Hydorclorothiazide, Lisinopril, Amitriptyline and Pravastatin from [facility]...the only medication that the patient is needing is the Vicodin rx.  The patient requests return call to [phone].

## 2013-12-30 NOTE — Telephone Encounter (Signed)
Pt notified that it is ready for p/u

## 2013-12-30 NOTE — Telephone Encounter (Signed)
Hydrocodone now refilled and ready for pickup.  Let me know if questions.

## 2014-01-03 ENCOUNTER — Other Ambulatory Visit: Payer: Self-pay | Admitting: Physician Assistant

## 2014-01-04 ENCOUNTER — Telehealth: Payer: Self-pay | Admitting: Family Medicine

## 2014-01-04 MED ORDER — AMITRIPTYLINE HCL 25 MG PO TABS: 25.0000 mg | ORAL_TABLET | Freq: Every day | ORAL | Status: DC

## 2014-01-04 NOTE — Telephone Encounter (Signed)
Sent in refill. Pt notified.

## 2014-01-04 NOTE — Telephone Encounter (Signed)
Refill on blood pressure on Elavil. [facility] [employer] [facility] [employer].   [phone]

## 2014-01-05 NOTE — Telephone Encounter (Signed)
Faxed

## 2014-01-06 ENCOUNTER — Other Ambulatory Visit: Payer: Self-pay | Admitting: Family Medicine

## 2014-01-08 ENCOUNTER — Ambulatory Visit: Payer: No Typology Code available for payment source | Admitting: Family Medicine

## 2014-01-08 ENCOUNTER — Telehealth: Payer: Self-pay | Admitting: *Deleted

## 2014-01-08 NOTE — Telephone Encounter (Signed)
Patient cancelled appt on 01/08/2014 @1 :45.   Wanted Dr. Carlota Raspberry to know blood work should be in this week for him to review.  Give him a call if sodium levels have increased. His next appt is 01/29/2014 @ 3:45 pm.  [phone]

## 2014-01-12 ENCOUNTER — Telehealth: Payer: Self-pay

## 2014-01-12 DIAGNOSIS — G894 Chronic pain syndrome: Secondary | ICD-10-CM

## 2014-01-12 NOTE — Telephone Encounter (Signed)
refill request on HYDROcodone-acetaminophen (NORCO/VICODIN) 5-325 MG per tablet  [phone]  Dr. Carlota Raspberry

## 2014-01-15 MED ORDER — HYDROCODONE-ACETAMINOPHEN 5-325 MG PO TABS
1.0000 | ORAL_TABLET | Freq: Four times a day (QID) | ORAL | Status: DC | PRN
Start: 2014-01-15 — End: 2014-01-29

## 2014-01-15 NOTE — Telephone Encounter (Signed)
Unable to see Rheum at [facility] until mid Oct. Has an appt to see a rheum in [facility] 02/05/14 (unable to pronounce name). He has an appt to see you on Monday so he said he'll talk to you about everything then.  Notified that rx is ready for p/u

## 2014-01-15 NOTE — Telephone Encounter (Signed)
Refill provided, ready for pickup.  Additionally: 1.  Pease advise him I did receive fax from rheumatologist.  Sodium level borderline (reading of 134, where 135-145 is normal), but stable, and no new med changes or eval needed at this point.  2. We were trying to refer him to other rheumatologist for 2nd opinion. Rheumatologist at [facility] recommended eval with Rheumatologist at [facility].  Did he have a name of someone there we can refer him to?  3.  If any questions - let me know -I will be happy to call him if needed

## 2014-01-16 ENCOUNTER — Telehealth: Payer: Self-pay | Admitting: Radiology

## 2014-01-16 NOTE — Telephone Encounter (Signed)
Discussed referral with Donna/ do not need new phone encounter/ opened in error.

## 2014-01-18 ENCOUNTER — Other Ambulatory Visit: Payer: Self-pay | Admitting: Physician Assistant

## 2014-01-19 ENCOUNTER — Telehealth: Payer: Self-pay

## 2014-01-19 DIAGNOSIS — M2550 Pain in unspecified joint: Secondary | ICD-10-CM

## 2014-01-19 DIAGNOSIS — M62838 Other muscle spasm: Secondary | ICD-10-CM

## 2014-01-19 NOTE — Telephone Encounter (Signed)
Pt calling for Dr.Grees says," I am getting muscle cramps all over, is this because of my low sodium, and if it is should I just eat more salt?" Please advise pt

## 2014-01-20 NOTE — Telephone Encounter (Signed)
1. Should not need refill of Soma if taking 3 times per day, as #60 were prescribed on July 3rd. Did he use them differently?  2. Recommend recheck in office to check CK level for muscles, and magnesium/potassium levels if cramps not improved with use of Soma, especially if full body cramps.  3. Was he instructed by other provider to take this dose of magnesium? Usually no more than 1600mg  of magnesium per day for supplementation and he appears to be taking much more than that.  4. Would need more info and office eval to discuss possible kidney stone. Would not be able to diagnose that over the phone. May need urine testing and possible ct scan.  5. Does not need to add sodium to diet, as this would not likely affect serum potassium.   Based on these various concerns, he may need to be seen in office prior to next scheduled appt. Unfortunately no openings at this point on my Monday appts, but I am in office tomorrow.

## 2014-01-20 NOTE — Telephone Encounter (Signed)
His sodium level was only borderline when last checked, so likely not the cause. Did his rheumatologist discuss muscle cramps with his other arthralgias and treatment?  Is he taking the soma 3 times per day? If still having breakthrough cramps, may need to return for eval in next few days to determine other causes and recheck some electrolytes or possibly magnesium level.

## 2014-01-20 NOTE — Telephone Encounter (Signed)
1) Pt says that he is having horrible cramps everywhere, in his ribs, jaw, calves, hamstrings etc. He says that he is unable to function with everyday stuff and he is not sleeping well. Pt did talk to rheum and they told him to add Ca, Vit D and Mg daily. He is now taking Mg Malate 600mg  6-8 capsules a day. He is also taking a combination pill of Ca 400, Vit D 400 and Mg 500 TID. In addition he is also taking a K+ supplement 99mg  qd.   2) He is worried about a kidney stone from the calcium supplement  3) He had been taking Soma TID (he is currently out--RF request sent to you) and he says that the [facility] does help, but doesn't make the muscle pains/spasms go away completely.   4) Wants to know if he should increase sodium in his diet by adding sea salt to his foods and see if that helps

## 2014-01-21 MED ORDER — CARISOPRODOL 350 MG PO TABS: ORAL_TABLET | ORAL | Status: DC

## 2014-01-21 NOTE — Addendum Note (Signed)
Addended by: [name] R on: 01/21/2014 12:50 PM   Modules accepted: Orders

## 2014-01-21 NOTE — Telephone Encounter (Addendum)
Can discuss calcium at appointment, but calcium may be associated with kidney stones.  Will refill soma, but not to exceed 3 times per day. Again - if cramping all over - needs to be seen here or in ER as discussed prior to scheduled visit.

## 2014-01-21 NOTE — Telephone Encounter (Signed)
1) Pt has an appt with you 01/29/14  2) Wants to know if taking a calcium supplement increases his risk of kidney stones.   3) Wants to know if we can RF the Soma until his appt with you. He has been taking it someday's QID due to the pain and cramping.

## 2014-01-21 NOTE — Telephone Encounter (Signed)
Pt notified and advised to not exceed TID anymore. Precautions below given. Rx faxed. Notified pharm that it was ok for an early RF.

## 2014-01-29 ENCOUNTER — Encounter: Payer: Self-pay | Admitting: Family Medicine

## 2014-01-29 ENCOUNTER — Ambulatory Visit (INDEPENDENT_AMBULATORY_CARE_PROVIDER_SITE_OTHER): Payer: No Typology Code available for payment source | Admitting: Family Medicine

## 2014-01-29 ENCOUNTER — Other Ambulatory Visit: Payer: Self-pay | Admitting: Family Medicine

## 2014-01-29 VITALS — BP 160/78 | HR 116 | Temp 99.0°F | Resp 16 | Ht 69.0 in | Wt 173.4 lb

## 2014-01-29 DIAGNOSIS — E871 Hypo-osmolality and hyponatremia: Secondary | ICD-10-CM

## 2014-01-29 DIAGNOSIS — N289 Disorder of kidney and ureter, unspecified: Secondary | ICD-10-CM

## 2014-01-29 DIAGNOSIS — R5383 Other fatigue: Secondary | ICD-10-CM

## 2014-01-29 DIAGNOSIS — I10 Essential (primary) hypertension: Secondary | ICD-10-CM

## 2014-01-29 DIAGNOSIS — R5381 Other malaise: Secondary | ICD-10-CM

## 2014-01-29 DIAGNOSIS — I776 Arteritis, unspecified: Secondary | ICD-10-CM

## 2014-01-29 DIAGNOSIS — N2889 Other specified disorders of kidney and ureter: Secondary | ICD-10-CM

## 2014-01-29 DIAGNOSIS — G894 Chronic pain syndrome: Secondary | ICD-10-CM

## 2014-01-29 DIAGNOSIS — R252 Cramp and spasm: Secondary | ICD-10-CM

## 2014-01-29 MED ORDER — HYDROCODONE-ACETAMINOPHEN 5-325 MG PO TABS: 1.0000 | ORAL_TABLET | Freq: Four times a day (QID) | ORAL | Status: DC | PRN

## 2014-01-29 NOTE — Progress Notes (Signed)
Subjective:  This chart was scribed for Johnny Agreste, MD by Johnny Bryant, Medial Scribe. This patient was seen in room 25 and the patient's care was started at 4:43 PM.    Patient ID: Johnny Bryant, male    DOB: [birth year], 63 y.o.   MRN: [id number]  HPI Johnny Bryant is a 63 y.o. male PCP: Johnny Agreste, MD Nephrologist: Dr. [name] Rheumatologist: Dr. Estanislado Pandy Urologist: Dr. Tresa Bryant   Patient presents to the [facility] requesting kidney follow up. Last visit 12/25/13.  Complicated medical history see prior notes as well as multiple recent telephone messages.   1. Suspected Vasculitis with chronic pain and polyarthralgias. Followed by Dr. Estanislado Pandy I have been prescribing hydrocodone and soma until underlying cause of pain determined with possible transition to pain mangagment. He is also followed by Dr. Justin Mend with nephrology. He is currently on Methotrexate and prior on Prednisone. Requested second opinion for Rheumatology with plan on seeing rheumatologist at [facility], but no appointment there until mid October. Does have appointment in [facility] with nephrologist on 03/08/14. Per telephone notes was having increase in muscle cramping does take Soma three times per day. Recommended evaluation in office next day. He is here for follow up today. Did have CMP on 01/09/2014 that had improved but borderline sodium at 134. Unknown most recent CPK as I do not see one in [facility]. Also on Elavil 20 mg at bedtime.  In regards to his muscle cramping, patient reports discontinuing methotrexate last week. He continued cramping namely in his hands, toes and sides. Patient reports associated sleep disturbance with cramping due to severity.  Patient advised to take magnesium and calcium by Dr. Estanislado Pandy and advised by nephrologist to drink Tonic, but denies significant improvement with any form of treatment.  Patient reports intolerance with historical use of Flexeril. Patient has not tried  any other medications.   2. Renal mass. Left cystic mass noted on CT 10/2012. Now followed by Dr. [name] at [facility]. Planned for repeat CT scan on 09 or 04/2014 of this year with partial nephrectomy possible. Patient reports scheduled surgery 03/26/14 for partial nephrectomy.   3. Hypertension. Increased Lisinopril to 40 mg QD at office visit last month. Continued Hydrochlorothiazide (HCTZ) at 12.5 mg QD. Most recent kidney function creatinine 0.86 on 01/09/2014. Does measure levels at home.   4. Hyperglycemia. Glucose 183 on 01/09/2014. He has been on prednisone but no known history of diabetes. Prednisone 20mg  QD for vasculitis.   5. Situational Anxiety. Discussed at visit one month ago and numbers provided for counseling. Patient reports seeing a counselor and reports having a good chat considering his medical issues. He states that the session was helpful.   Patient reports intermittent headaches 3-4 times a week and increased fatigue with exertion.      Patient Active Problem List   Diagnosis Date Noted  . Vasculitis 12/25/2013  . Left renal mass 11/09/2013  . Palpitation 10/31/2013  . Sudden hearing loss, unspecified 09/28/2013  . Sensorineural hearing loss, unspecified 09/28/2013  . Chronic pain syndrome 08/18/2013  . Hematuria 08/18/2013  . Heme positive stool 08/18/2013   Past Medical History  Diagnosis Date  . Hypertension   . Hypercholesteremia   . Arthritis   . Chronic back pain    Past Surgical History  Procedure Laterality Date  . Elbow surgery Right   . Shoulder surgery Left     x 3  . Cosmetic surgery    . Fracture surgery Left  wrist  . Vasectomy     Allergies  Allergen Reactions  . Cymbalta [Duloxetine Hcl] Other (See Comments)    Cranky and moods changes    Prior to Admission medications   Medication Sig Start Date End Date Taking? Authorizing Provider  amitriptyline (ELAVIL) 25 MG tablet Take 1 tablet (25 mg total) by mouth at bedtime. 01/04/14    Theda Sers, PA-C  b complex vitamins tablet Take 1 tablet by mouth daily.    Historical Provider, MD  Calcium Carbonate-Vitamin D (CALCIUM 500 + D) 500-125 MG-UNIT TABS Take by mouth.    Historical Provider, MD  carisoprodol (SOMA) 350 MG tablet TAKE ONE TABLET BY MOUTH THREE TIMES DAILY AS NEEDED FOR MUSCLE SPASM 01/21/14   Johnny Agreste, MD  hydrochlorothiazide (MICROZIDE) 12.5 MG capsule Take 1 capsule (12.5 mg total) by mouth daily. 12/25/13   Johnny Agreste, MD  HYDROcodone-acetaminophen (NORCO/VICODIN) 5-325 MG per tablet Take 1-2 tablets by mouth every 6 (six) hours as needed for moderate pain. 01/15/14   Johnny Agreste, MD  lisinopril (PRINIVIL,ZESTRIL) 30 MG tablet TAKE ONE TABLET BY MOUTH ONE TIME DAILY     Chelle S Jeffery, PA-C  lisinopril (PRINIVIL,ZESTRIL) 40 MG tablet Take 1 tablet (40 mg total) by mouth daily. 12/25/13   Johnny Agreste, MD  Magnesium Ascorbate POWD by Does not apply route.    Historical Provider, MD  methotrexate 2.5 MG tablet Take 20 mg by mouth once a week.    Historical Provider, MD  Misc Natural Products (TURMERIC CURCUMIN) CAPS Take 1 capsule by mouth daily.    Historical Provider, MD  Multiple Vitamin (MULTIVITAMIN WITH MINERALS) TABS tablet Take 1 tablet by mouth daily.    Historical Provider, MD  omeprazole (PRILOSEC) 20 MG capsule take 1 capsule by mouth once daily 12/11/13   Johnny Agreste, MD  pravastatin (PRAVACHOL) 20 MG tablet Take 1 tablet (20 mg total) by mouth daily. 12/25/13   Johnny Agreste, MD  predniSONE (DELTASONE) 20 MG tablet Take 40 mg by mouth daily with breakfast.    Historical Provider, MD   History   Social History  . Marital Status: Significant Other    Spouse Name: N/A    Number of Children: 2  . Years of Education: N/A   Occupational History  . sales    Social History Main Topics  . Smoking status: Former Smoker    Types: Cigarettes    Quit date: 11/19/1979  . Smokeless tobacco: Never Used  . Alcohol Use: No    . Drug Use: No  . Sexual Activity: Not on file   Other Topics Concern  . Not on file   Social History Narrative  . No narrative on file       Review of Systems  Constitutional: Positive for fatigue. Negative for fever and chills.  Respiratory: Negative for shortness of breath.   Cardiovascular: Negative for chest pain.  Genitourinary: Negative for dysuria.  Musculoskeletal: Positive for arthralgias, back pain and neck pain.  Neurological: Positive for headaches.       Objective:   Physical Exam  Nursing note and vitals reviewed. Constitutional: He is oriented to person, place, and time. He appears well-developed and well-nourished. No distress.  HENT:  Head: Normocephalic and atraumatic.  Eyes: EOM are normal.  Neck: Neck supple.  Cardiovascular: Normal rate.   Pulmonary/Chest: Effort normal. No respiratory distress.  Musculoskeletal: Normal range of motion.  Neurological: He is alert and oriented to person, place,  and time.  Skin: Skin is warm and dry.  Lacy reticular rash on upper and lower extremities.  Psychiatric: He has a normal mood and affect. His behavior is normal.   Filed Vitals:   01/29/14 1607  BP: 160/78  Pulse: 116  Temp: 99 F (37.2 C)  TempSrc: Oral  Resp: 16  Height: 5\' 9"  (1.753 m)  Weight: 173 lb 6.4 oz (78.654 kg)  SpO2: 100%       Assessment & Plan:   Juniel Groene is a 63 y.o. male Chronic pain syndrome - Plan: Ambulatory referral to Pain Clinic, HYDROcodone-acetaminophen (NORCO/VICODIN) 5-325 MG per tablet  -refilled hydrocodone and soma for now with chronic back, neck, L shoulder pain, but with vasculitis, underling rheumatologic cause thought initially.  However as still requiring these medications even on MTX and prednisone (now of MTX in anticipation of renal surgery) - will refer to pain mgt to assume care and discuss med options, possible long acting regimen.   Vasculitis  - 2nd opinion with rheum pending. Continue prednisone,  and followed by rheum locally.   Essential hypertension - Plan: BASIC METABOLIC PANEL WITH GFR, TSH  -increased in office, s/p increased ACE-I dose last ov. Will hold on further chages at this time, but asked him to discuss his blood pressure with upcoming nephrologist eval to see if they had other recommendations on mgt.  RTC/ER precautions.   Other malaise and fatigue - Plan: BASIC METABOLIC PANEL WITH GFR, CBC with Differential, TSH   -suspect d/t underlying chronic pain, medications or vasculitis? - labs pending, but follow up with rheumatologist.   Muscle cramps - Plan: CK, Aldolase  - continued Soma or now as some relief with this, but discussed trying to avoid long term use of this medicine, can continue Mg supplementation and other treatments recommended by rheumatologist and nephrologist until labs return.  Renal mass  Hyponatremia - Plan: BASIC METABOLIC PANEL WITH GFR  - improved o most recent outside labs. CMP pending.   Meds ordered this encounter  Medications  . HYDROcodone-acetaminophen (NORCO/VICODIN) 5-325 MG per tablet    Sig: Take 1-2 tablets by mouth every 6 (six) hours as needed for moderate pain.    Dispense:  84 tablet    Refill:  0   Patient Instructions  We will check muscle tests to look for other cause of cramping.  Ok to continue soma and other recommendations from rheumatologist and nephrologist. You should receive a call or letter about your lab results within the next week to 10 days.  Return to the clinic or go to the nearest emergency room if any of your symptoms worsen or new symptoms occur.  Ask your nephrologist about your blood pressure and any recommended medication changes. With low sodium prior - I will wait on any changes for now until labs back. Keep a record of your blood pressures outside of the office and bring them to the next office visit or call in with these readings prior.  We will refer you to pain management office.        I  personally performed the services described in this documentation, which was scribed in my presence. The recorded information has been reviewed and considered, and addended by me as needed.

## 2014-01-29 NOTE — Patient Instructions (Signed)
We will check muscle tests to look for other cause of cramping.  Ok to continue soma and other recommendations from rheumatologist and nephrologist. You should receive a call or letter about your lab results within the next week to 10 days.  Return to the clinic or go to the nearest emergency room if any of your symptoms worsen or new symptoms occur.  Ask your nephrologist about your blood pressure and any recommended medication changes. With low sodium prior - I will wait on any changes for now until labs back. Keep a record of your blood pressures outside of the office and bring them to the next office visit or call in with these readings prior.  We will refer you to pain management office.

## 2014-01-30 LAB — CK: Total CK: 36 U/L (ref 7–232)

## 2014-01-30 LAB — CBC WITH DIFFERENTIAL/PLATELET
BASOS ABS: 0 10*3/uL (ref 0.0–0.1)
Basophils Relative: 0 % (ref 0–1)
EOS PCT: 0 % (ref 0–5)
Eosinophils Absolute: 0 10*3/uL (ref 0.0–0.7)
HCT: 37.1 % — ABNORMAL LOW (ref 39.0–52.0)
HEMOGLOBIN: 13.2 g/dL (ref 13.0–17.0)
LYMPHS ABS: 1.1 10*3/uL (ref 0.7–4.0)
LYMPHS PCT: 10 % — AB (ref 12–46)
MCH: 30.8 pg (ref 26.0–34.0)
MCHC: 35.6 g/dL (ref 30.0–36.0)
MCV: 86.7 fL (ref 78.0–100.0)
MONO ABS: 0.6 10*3/uL (ref 0.1–1.0)
MONOS PCT: 5 % (ref 3–12)
Neutro Abs: 9.7 10*3/uL — ABNORMAL HIGH (ref 1.7–7.7)
Neutrophils Relative %: 85 % — ABNORMAL HIGH (ref 43–77)
Platelets: 376 10*3/uL (ref 150–400)
RBC: 4.28 MIL/uL (ref 4.22–5.81)
RDW: 15 % (ref 11.5–15.5)
WBC: 11.4 10*3/uL — ABNORMAL HIGH (ref 4.0–10.5)

## 2014-01-30 LAB — BASIC METABOLIC PANEL WITH GFR
BUN: 12 mg/dL (ref 6–23)
CHLORIDE: 94 meq/L — AB (ref 96–112)
CO2: 26 mEq/L (ref 19–32)
CREATININE: 0.86 mg/dL (ref 0.50–1.35)
Calcium: 9.7 mg/dL (ref 8.4–10.5)
GFR, Est African American: >89 mL/min
GFR, Est Non African American: >89 mL/min
Glucose, Bld: 134 mg/dL — ABNORMAL HIGH (ref 70–99)
Potassium: 4.4 mEq/L (ref 3.5–5.3)
Sodium: 135 mEq/L (ref 135–145)

## 2014-01-30 LAB — TSH: TSH: 0.835 u[IU]/mL (ref 0.350–4.500)

## 2014-01-31 LAB — ALDOLASE: Aldolase: 6.8 U/L (ref <=8.1)

## 2014-02-03 HISTORY — PX: OTHER SURGICAL HISTORY: SHX169

## 2014-02-05 ENCOUNTER — Telehealth: Payer: Self-pay

## 2014-02-05 NOTE — Telephone Encounter (Signed)
Patient called regarding lab results. Please return call and advise. Thank you.

## 2014-02-05 NOTE — Telephone Encounter (Signed)
Pt requested lab results to fax to his Rheumatologist. He has an appt this evening. Advised pt Dr. Carlota Raspberry had not reviewed yet. He still wanted copy faxed.

## 2014-02-10 ENCOUNTER — Telehealth: Payer: Self-pay

## 2014-02-10 DIAGNOSIS — G894 Chronic pain syndrome: Secondary | ICD-10-CM

## 2014-02-10 NOTE — Telephone Encounter (Signed)
Pt called in asking for a refill on his HYDROcodone-acetaminophen (NORCO/VICODIN) 5-325 MG per tablet medication from Dr. Carlota Raspberry.  His call  Back number is [phone].

## 2014-02-11 ENCOUNTER — Other Ambulatory Visit: Payer: Self-pay | Admitting: Family Medicine

## 2014-02-11 ENCOUNTER — Encounter: Payer: Self-pay | Admitting: *Deleted

## 2014-02-11 MED ORDER — HYDROCODONE-ACETAMINOPHEN 5-325 MG PO TABS: 1.0000 | ORAL_TABLET | Freq: Four times a day (QID) | ORAL | Status: DC | PRN

## 2014-02-11 NOTE — Telephone Encounter (Signed)
It appears initial attempt at pain management office was not accepted - Do we have another office or referral pending? I will refill medication as prior, but will need to see pain mgt  - can we check into status of this referral and send to other office if needed.

## 2014-02-11 NOTE — Addendum Note (Signed)
Addended by: [name] R on: 02/11/2014 01:51 PM   Modules accepted: Orders

## 2014-02-11 NOTE — Telephone Encounter (Signed)
Patient notified and voiced understanding. He will pick up RX

## 2014-02-12 NOTE — Telephone Encounter (Signed)
Referrals is going to be sending the pain clinic referral request to [facility] -dr wheeler

## 2014-02-13 NOTE — Telephone Encounter (Signed)
Dr. Greene, FYI.

## 2014-02-13 NOTE — Telephone Encounter (Signed)
Called in Rx. Dr Carlota Raspberry, I checked referral notes and it appears that Referrals sent another referral to a 2nd pain clinic yesterday. Nothing scheduled yet.

## 2014-02-13 NOTE — Telephone Encounter (Signed)
See other refill message - Is he lined up with appt at other pain mgt yet? I will refill Soma at this time, but as I discussed at last OV - needs to be transitioned into pain management if these are going to be chronically needed. It appears he was not accepted to one office - not sure of others.

## 2014-02-19 ENCOUNTER — Ambulatory Visit (INDEPENDENT_AMBULATORY_CARE_PROVIDER_SITE_OTHER): Payer: No Typology Code available for payment source | Admitting: Family Medicine

## 2014-02-19 ENCOUNTER — Encounter: Payer: Self-pay | Admitting: Family Medicine

## 2014-02-19 VITALS — BP 146/70 | HR 112 | Temp 98.9°F | Resp 16 | Ht 69.5 in | Wt 177.8 lb

## 2014-02-19 DIAGNOSIS — I10 Essential (primary) hypertension: Secondary | ICD-10-CM

## 2014-02-19 DIAGNOSIS — G4700 Insomnia, unspecified: Secondary | ICD-10-CM

## 2014-02-19 DIAGNOSIS — R739 Hyperglycemia, unspecified: Secondary | ICD-10-CM

## 2014-02-19 DIAGNOSIS — I776 Arteritis, unspecified: Secondary | ICD-10-CM

## 2014-02-19 DIAGNOSIS — G894 Chronic pain syndrome: Secondary | ICD-10-CM

## 2014-02-19 DIAGNOSIS — R7309 Other abnormal glucose: Secondary | ICD-10-CM
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
## 2014-02-19 NOTE — Patient Instructions (Signed)
Look at the article from [place] on insomnia.  I would not recommend additional sedating medicine with other meds you are on. NO changes to blood pressure medicines.  Good luck this week!Marland Kitchen

## 2014-02-27 ENCOUNTER — Telehealth: Payer: Self-pay | Admitting: Family Medicine

## 2014-02-27 DIAGNOSIS — G894 Chronic pain syndrome: Secondary | ICD-10-CM

## 2014-02-27 NOTE — Telephone Encounter (Signed)
Patient needs a refill on Vicodin. Also states that his wife Lupita Dawn will be picking up for him. Finally, patient wants Dr. Carlota Raspberry to know that his surgery went well.   [phone]

## 2014-03-01 MED ORDER — HYDROCODONE-ACETAMINOPHEN 5-325 MG PO TABS: 1.0000 | ORAL_TABLET | Freq: Four times a day (QID) | ORAL | Status: DC | PRN

## 2014-03-01 NOTE — Telephone Encounter (Signed)
Please advise. Able to get refill?

## 2014-03-01 NOTE — Telephone Encounter (Signed)
Patient is calling for follow up on a refill request for Hydrocodone-Acetaminophen (Tab). Please call patient when ready [phone]

## 2014-03-01 NOTE — Telephone Encounter (Signed)
Pain mgt eval pending 03/14/14. Based on his discharge summary from 8.22.15, was discharged on oxycodone.  Should not be taking hydrocodone and this medicine together. Please verify if he is taking this and how often, and how may were prescribed.

## 2014-03-01 NOTE — Telephone Encounter (Signed)
Patient is calling to follow up on request for med refills for

## 2014-03-01 NOTE — Telephone Encounter (Signed)
I will refill the hydrocodone, but do not combine with oxycodone. I will refill the hydrocodone, but keep follow up as planned with pain management on 03/14/14. rx ready for pickup.

## 2014-03-01 NOTE — Telephone Encounter (Signed)
He was prescribed 30 oxycodone and takes 1 pill every 4-6 hours. He would rather take vicodin instead of the oxycodone. He says the oxycodone makes him crabby and mean.

## 2014-03-02 NOTE — Telephone Encounter (Signed)
Notified pt ready. He stated that his wife Johnny Bryant will p/up.  Dr Carlota Raspberry, pt wanted me to give you two messages. First he thanks you for the Rx and he will not combine it or abuse it. Second, he wanted to let you know he got the biopsy results back and it was cancer. He had surgery and they feel that they got all of the cancer off the kidney and no other therapy is needed at this time (no chemo/rad).

## 2014-03-15 NOTE — Telephone Encounter (Signed)
Patient states he met with pain management and all is well, he is cancelling his appointment with Dr. Greene scheduled for Monday.  ° °[phone] °

## 2014-03-19 ENCOUNTER — Ambulatory Visit: Payer: No Typology Code available for payment source | Admitting: Family Medicine

## 2014-04-13 DIAGNOSIS — R319 Hematuria, unspecified: Secondary | ICD-10-CM

## 2014-04-16 ENCOUNTER — Ambulatory Visit: Payer: No Typology Code available for payment source | Admitting: Family Medicine

## 2014-04-25 ENCOUNTER — Ambulatory Visit (INDEPENDENT_AMBULATORY_CARE_PROVIDER_SITE_OTHER): Payer: No Typology Code available for payment source | Admitting: Neurology

## 2014-04-25 ENCOUNTER — Encounter: Payer: Self-pay | Admitting: Neurology

## 2014-04-25 VITALS — BP 169/92 | HR 95 | Temp 98.9°F | Resp 18 | Ht 69.5 in | Wt 185.6 lb

## 2014-04-25 DIAGNOSIS — G609 Hereditary and idiopathic neuropathy, unspecified: Secondary | ICD-10-CM | POA: Insufficient documentation

## 2014-04-25 DIAGNOSIS — H4923 Sixth [abducent] nerve palsy, bilateral: Secondary | ICD-10-CM

## 2014-04-25 DIAGNOSIS — I776 Arteritis, unspecified: Secondary | ICD-10-CM

## 2014-04-25 DIAGNOSIS — G509 Disorder of trigeminal nerve, unspecified: Secondary | ICD-10-CM

## 2014-04-25 DIAGNOSIS — C642 Malignant neoplasm of left kidney, except renal pelvis: Secondary | ICD-10-CM

## 2014-04-25 NOTE — Patient Instructions (Signed)
Overall you are doing fairly well but I do want to suggest a few things today:   Remember to drink plenty of fluid, eat healthy meals and do not skip any meals. Try to eat protein with a every meal and eat a healthy snack such as fruit or nuts in between meals. Try to keep a regular sleep-wake schedule and try to exercise daily, particularly in the form of walking, 20-30 minutes a day, if you can.   As far as your medications are concerned, I would like to suggest  As far as diagnostic testing: EMG/NCS, CT and CTA of the head/brain  I would like to see you back within one-two weeks for EMG/NCS, Please schedule at noon or the last appointment of the day. Please call us with any interim questions, concerns, problems, updates or refill requests.   Please also call us for any test results so we can go over those with you on the phone.  My clinical assistant and will answer any of your questions and relay your messages to me and also relay most of my messages to you.   Our phone number is [phone]. We also have an after hours call service for urgent matters and there is a physician on-call for urgent questions. For any emergencies you know to call 911 or go to the nearest emergency room

## 2014-04-25 NOTE — Progress Notes (Signed)
[facility]    Provider:  Dr Jaynee Eagles Referring Provider: [name], MD Primary Care Physician:  [name], MD  CC:  Numbness and Paresthesias  HPI:  Johnny Bryant is a 63 y.o. male here as a referral from Dr. 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 Estanislado Pandy and Dr. Justin Mend respectively) here for evaluation of numbness and paresthesias in the feet and hands. He has numbness in the feet and in the tips of the fingers for 6 months. He reports it is symmetric and gets cramping in the calf muscles, feet and fingers. He also has numbness in his left jaw, dog scratched him and he didn't feel it.  Does get tingling in the fingertips 2-5 bilaterally. More aware when gripping items. No nocturnal awakening for paresthesias in the hands. Has neck pain and has a C5 compression fracture since the age of 50. Has LBP. No radicular pain into the arms and legs. Balance is good, no falls. No dizziness when standing. No history of significant alcohol use. No Hx of chemotherapy, no radiation. Never on isoniazid, was on methotrexate but symptoms did not coincide with the medication, no flagyl, no amiodarone, no medications for gout, no heavy metal exposure. The symptoms have not changed for the last 4-5 months. No Hx B12 deficiancy.   Is having weakness, fatigue, general malaise. Can get out of a low-seated chair, and up stairs ok, no problems lifting arms overhead.  Hearing has worsened. Needs to go get new glasses as vision is  A lot worse, very blurry, double,  he hears noises that are not there like a humming of a refrigerator. Has bad headaches behind both eyes that can be 10/10 needs to lay down in a dark room, +light sensitivity.   Reviewed notes, labs and imaging from outside physicians, which showed: Left partial nephrectomy for [facility] August  21st at [facility], Doing well on $Remov'10mg'dszhKM$  Prednisone daily, Hx of fatigue, livedo reticularis, Raynaud's, arthritis, crvical and lumbosacral degenerative disk disease, [facility], also has pmhx of depression, HLD, osteopenia, HTN, anxiety. He did not tolerate methotrexate and declines imuran at this time.He has had hematuria from past anti-inflammatoty use. 60% hearing loss right ear and complete loss in the left.   CMP with elevated glucose (183) CBC with elevated WBC 11.8 WNL: CK 36, TSH, aldolase, cryoglobulin, dsDNA, SSA/SSB, ENA SM Ab, SM/RNP, Scleroderma, ccp, esr, acute hep panel, cardiolipin antibodies, b2-glycoprotein,c3/c4, ace, quanterferon gold TB, lupus anticoagulant panel,    Review of Systems: Patient complains of symptoms per HPI as well as the following symptoms fatigue, numbness, weakness, sleepiness, snoring, RLS, hearing loss, ringing in ears, cramps, aching muscles, skin sensitivity, rosacea. Pertinent negatives per HPI. All others negative.   History   Social History  . Marital Status: Significant Other    Spouse Name: N/A    Number of Children: 2  . Years of Education: N/A   Occupational History  . sales    Social History Main Topics  . Smoking status: Former Smoker    Types: Cigarettes    Quit date: 11/19/1979  . Smokeless tobacco: Never Used  . Alcohol Use: No  . Drug Use: No  . Sexual Activity: Not on file   Other Topics Concern  . Not on file   Social History Narrative  . No narrative on file    Family  History  Problem Relation Age of Onset  . Hyperlipidemia Mother   . Stroke Mother   . Diabetes Father   . Heart disease Father 81    MI, alive age 65  . Hyperlipidemia Father   . Hypertension Father   . Hypertension Mother   . Heart attack Maternal Grandfather 40  . Cancer Paternal Grandmother     unknown type  . Heart attack Paternal Grandfather 74    Past Medical History  Diagnosis Date  . Hypertension   . Hypercholesteremia   .  Arthritis   . Chronic back pain     Past Surgical History  Procedure Laterality Date  . Elbow surgery Right   . Shoulder surgery Left     x 3  . Cosmetic surgery    . Fracture surgery Left     wrist  . Vasectomy      Current Outpatient Prescriptions  Medication Sig Dispense Refill  . amitriptyline (ELAVIL) 25 MG tablet Take 1 tablet (25 mg total) by mouth at bedtime.  30 tablet  3  . b complex vitamins tablet Take 1 tablet by mouth daily.      . Calcium Carbonate-Vitamin D (CALCIUM 500 + D) 500-125 MG-UNIT TABS Take 1,000 mg by mouth.       . gabapentin (NEURONTIN) 100 MG capsule Take 100 mg by mouth as needed.       . hydrochlorothiazide (MICROZIDE) 12.5 MG capsule Take 1 capsule (12.5 mg total) by mouth daily.  90 capsule  1  . lisinopril (PRINIVIL,ZESTRIL) 40 MG tablet Take 1 tablet (40 mg total) by mouth daily.  90 tablet  1  . Magnesium Ascorbate POWD 400 mg by Does not apply route daily.       . Misc Natural Products (TURMERIC CURCUMIN) CAPS Take 1 capsule by mouth daily.      . Multiple Vitamin (MULTIVITAMIN WITH MINERALS) TABS tablet Take 1 tablet by mouth daily.      Marland Kitchen omeprazole (PRILOSEC) 20 MG capsule take 1 capsule by mouth once daily  30 capsule  1  . Oxycodone HCl 10 MG TABS Take 10 mg by mouth 3 (three) times daily.       . Potassium 99 MG TABS Take by mouth daily.       . pravastatin (PRAVACHOL) 20 MG tablet Take 1 tablet (20 mg total) by mouth daily.  90 tablet  1  . predniSONE (DELTASONE) 10 MG tablet Take 10 mg by mouth daily with breakfast.      . zinc gluconate 50 MG tablet Take 50 mg by mouth daily.       No current facility-administered medications for this visit.    Allergies as of 04/25/2014 - Review Complete 04/25/2014  Allergen Reaction Noted  . Cymbalta [duloxetine hcl] Other (See Comments) and Anxiety 09/05/2013  . Peanut oil Rash 04/25/2014    Vitals: BP 169/92  Pulse 95  Temp(Src) 98.9 F (37.2 C) (Oral)  Resp 18  Ht 5' 9.5" (1.765 m)   Wt 185 lb 9.6 oz (84.188 kg)  BMI 27.02 kg/m2  SpO2 99% Last Weight:  Wt Readings from Last 1 Encounters:  04/25/14 185 lb 9.6 oz (84.188 kg)   Last Height:   Ht Readings from Last 1 Encounters:  04/25/14 5' 9.5" (1.765 m)      Physical exam: Exam: Gen: NAD, conversant, well nourised, well groomed  CV: RRR, no MRG. No Carotid Bruits. No peripheral edema, warm, nontender Eyes: Conjunctivae clear without exudates or hemorrhage  Neuro: Detailed Neurologic Exam  Speech:    Speech is normal; fluent and spontaneous with normal comprehension.  Cognition:    The patient is oriented to person, place, and time;     recent and remote memory intact;     language fluent;     normal attention, concentration,    fund of knowledge Cranial Nerves:    The pupils are equal, round, and reactive to light. The fundi are flat, without papilledema. Visual fields are full to finger confrontation. Impaired bilateral abduction, decreased sensation left V3 distribution, assymmetric lids due to injury (chronic), The palate elevates in the midline. Voice is normal. Shoulder shrug is normal. The tongue has normal motion without fasciculations.   Coordination:    No dysmetria  Gait:    Normal native gait  Motor Observation:    No asymmetry, no atrophy, and no involuntary movements noted. Tone:    Normal muscle tone.    Posture:    Posture is normal.    Strength:    Strength is V/V in the upper and lower limbs.      Sensation:      Pin prick:  Impaired distally in the bilateral arms. Impaired distally in the right leg. Intact in the left leg.   Cold: Impaired distally in all extremities  Vibration: 10 seconds left great toe and 17 seconds right great toe  Intact proprioception lower exremities  Reflex Exam: achilles reflexes are 1+ otherwise brisk. Symmetric throughout.    Toes:    The toes are downgoing bilaterally.   Clonus:    Clonus is  absent.   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 Estanislado Pandy and Dr. Justin Mend respectively) here for evaluation of 62-months of numbness and paresthesias in the feet and hands.  He also has numbness in his left face. Exam shows assymetric sensory loss in the distal extremities and in the left face along with impaired eye abduction. Concerning for brain lesion affecting CNV and CNVi or possibly pathology in the cavernous sinus such as aneurysm. Will order a CT of the head and CTA head (MRI contraindicated as he has metal in his chest), will complete serum neuropathy screen and also do an emg/ncs to further evaluate neuropathy specifically looking for symmetry vs assymetry to try and see if symptoms are related to his vasculitis or to some other process.   Sarina Ill, MD  [facility] [address] [place], [place] [address]  Phone [phone] Fax [phone]

## 2014-04-26 ENCOUNTER — Telehealth: Payer: Self-pay | Admitting: Neurology

## 2014-04-26 NOTE — Telephone Encounter (Signed)
Would you let him know that he will have to do labs but no fasting needed. Thank you

## 2014-04-26 NOTE — Telephone Encounter (Signed)
Would you let him know that I scheduled his EMG/NCS on Thursday the 29th. He will have to be here at 2:30pm and when he checks in let the front desk person know that he has labs to do before his emg/ncs appointment starts at 3pm (the lab closes at 4:15 so he needs to have them done before the emg/ncs or he won't have time). No fasting required.

## 2014-04-26 NOTE — Telephone Encounter (Signed)
Patient calling to check if Dr. Jaynee Eagles wants him to do labwork when he comes in for his NCV/EMG on 10/26 and wants to know if he needs to be fasting or not, please return call and advise.

## 2014-04-30 ENCOUNTER — Encounter: Payer: No Typology Code available for payment source | Admitting: Neurology

## 2014-04-30 ENCOUNTER — Telehealth: Payer: Self-pay | Admitting: Neurology

## 2014-04-30 DIAGNOSIS — G609 Hereditary and idiopathic neuropathy, unspecified: Secondary | ICD-10-CM

## 2014-04-30 NOTE — Telephone Encounter (Signed)
Johnny Bryant from [facility] is calling. Patient has an appointment for a CT scan on Friday and needs to have an order for a BUN and Creatinine level put in the computer. Patient will have this done at [facility].

## 2014-05-03 ENCOUNTER — Telehealth: Payer: Self-pay | Admitting: Neurology

## 2014-05-03 ENCOUNTER — Ambulatory Visit (INDEPENDENT_AMBULATORY_CARE_PROVIDER_SITE_OTHER): Payer: No Typology Code available for payment source | Admitting: Neurology

## 2014-05-03 ENCOUNTER — Ambulatory Visit (INDEPENDENT_AMBULATORY_CARE_PROVIDER_SITE_OTHER): Payer: Self-pay

## 2014-05-03 DIAGNOSIS — Z0289 Encounter for other administrative examinations: Secondary | ICD-10-CM

## 2014-05-03 DIAGNOSIS — C642 Malignant neoplasm of left kidney, except renal pelvis: Secondary | ICD-10-CM

## 2014-05-03 DIAGNOSIS — G609 Hereditary and idiopathic neuropathy, unspecified: Secondary | ICD-10-CM

## 2014-05-03 DIAGNOSIS — I776 Arteritis, unspecified: Secondary | ICD-10-CM

## 2014-05-03 NOTE — Progress Notes (Signed)
  [facility]   Provider: Dr Jaynee Eagles  Referring Provider: [name], MD  Primary Care Physician: [name], MD    CC: Numbness and Paresthesias   HPI: Johnny Bryant is a 63 y.o. male here as a referral from Dr. 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 Estanislado Pandy and Dr. Justin Mend respectively) here for evaluation of numbness and paresthesias in the feet and hands. He has numbness in the feet and in the tips of the fingers for 6 months. He reports it is symmetric and gets cramping in the calf muscles, feet and fingers. He also has numbness in his left jaw, dog scratched him and he didn't feel it. Does get tingling in the fingertips 2-5 bilaterally. More aware when gripping items. No nocturnal awakening for paresthesias in the hands. Has neck pain and has a C5 compression fracture since the age of 12. Has LBP. No radicular pain into the arms and legs. Balance is good, no falls. No dizziness when standing. No history of significant alcohol use. No Hx of chemotherapy, no radiation. Never on isoniazid, was on methotrexate but symptoms did not coincide with the medication, no flagyl, no amiodarone, no medications for gout, no heavy metal exposure. The symptoms have not changed for the last 4-5 months. No Hx B12 deficiency.   Summary  Nerve conduction studies were performed on the right upper and bilateral lower extremities:  The right Median motor nerve showed normal conductions with normal F Wave latency The right Ulnar motor nerve showed reduced amplitude (3.56mV, N>5) and  delayed F Wave latency(68ms, N<34) Bilateral Peroneal motor nerves showed normal conductions with normal F Wave latencies Bilateral Tibial motor nerves showed normal conductions with normal F Wave latencies  The right second-digit Median sensory nerve conduction was  within normal limits The right fifth-digit Ulnar sensory nerve conduction was within normal limits  Bilateral Sural and Peroneal sensory nerve conductions were within normal limits Bilateral H Reflexes showed normal latencies  EMG Needle study was performed on selected right lower extremity muscles:   The Vastus Medialis, Anterior Tibialis, Medial Gastrocnemius, Extensor Hallucis Longus and Abductor Hallucis muscles were within normal limits.  Conclusion: No electrophysiologic evidence for large-fiber peripheral polyneuropathy.However a small fiber neuropathy could be responsible for patient's symptoms and still evade detection by this study. Prolonged Ulnar motor F wave latency with reduced amplitude could be consistent with his previous history of Ulnar neuropathy s/p transposition. Clinical correlation recommended.    Sarina Ill, MD  [facility] [address] [place], [place] [address]  Phone [phone] Fax [phone]

## 2014-05-03 NOTE — Telephone Encounter (Signed)
Please call this person and give her mr Aguirre's lab values, thanks

## 2014-05-03 NOTE — Telephone Encounter (Signed)
Patient will have labs drawn here today in our office before NCV/EMG.

## 2014-05-03 NOTE — Telephone Encounter (Signed)
Spoke to patient. Gave instructions per Dr. Cathren Laine previous note.

## 2014-05-03 NOTE — Telephone Encounter (Signed)
Johnny Bryant from [facility] calling to get patient's lab results, states that they cannot do CT scan tomorrow without the results, please return call and advise.

## 2014-05-04 ENCOUNTER — Inpatient Hospital Stay: Admission: RE | Admit: 2014-05-04 | Payer: No Typology Code available for payment source | Source: Ambulatory Visit

## 2014-05-04 ENCOUNTER — Other Ambulatory Visit: Payer: Self-pay | Admitting: Family Medicine

## 2014-05-04 ENCOUNTER — Other Ambulatory Visit: Payer: No Typology Code available for payment source

## 2014-05-04 NOTE — Telephone Encounter (Signed)
Can you call and see if they got his lab results?? The bmp you ordered yesterday for me. thanks

## 2014-05-07 NOTE — Telephone Encounter (Signed)
Called Wells Guiles. Apologized and explained BMP was ordered but not collected. Wells Guiles stated patient will have CT scan on this Friday (05/11/14). Called [facility], added BMP. Called patient to inform, no answer.

## 2014-05-07 NOTE — Telephone Encounter (Signed)
Patient calling to state that he was never able to go and get that CT scan on Friday because the lab results were not received in time, patient wants to know if he can go anywhere for this CT scan, states he wants to go to [facility]. Please return call and advise.

## 2014-05-08 ENCOUNTER — Other Ambulatory Visit: Payer: No Typology Code available for payment source

## 2014-05-08 LAB — IFE AND PE, SERUM
Albumin SerPl Elph-Mcnc: 4.3 g/dL (ref 3.2–5.6)
Albumin/Glob SerPl: 1.5 (ref 0.7–2.0)
Alpha 1: 0.2 g/dL (ref 0.1–0.4)
Alpha2 Glob SerPl Elph-Mcnc: 0.8 g/dL (ref 0.4–1.2)
B-Globulin SerPl Elph-Mcnc: 1 g/dL (ref 0.6–1.3)
GAMMA GLOB SERPL ELPH-MCNC: 0.9 g/dL (ref 0.5–1.6)
GLOBULIN, TOTAL: 2.9 g/dL (ref 2.0–4.5)
IGA/IMMUNOGLOBULIN A, SERUM: 182 mg/dL (ref 91–414)
IGG (IMMUNOGLOBIN G), SERUM: 864 mg/dL (ref 700–1600)
IGM (IMMUNOGLOBULIN M), SRM: 197 mg/dL (ref 40–230)
Total Protein: 7.2 g/dL (ref 6.0–8.5)

## 2014-05-08 LAB — HEAVY METALS, BLOOD
Arsenic: 6 ug/L (ref 2–23)
Lead, Blood: 1 ug/dL (ref 0–19)
MERCURY: 2.4 ug/L (ref 0.0–14.9)

## 2014-05-08 LAB — VITAMIN B1, WHOLE BLOOD: THIAMINE: 192.5 nmol/L (ref 66.5–200.0)

## 2014-05-08 LAB — B12 AND FOLATE PANEL: Vitamin B-12: 755 pg/mL (ref 211–946)

## 2014-05-08 LAB — RPR: SYPHILIS RPR SCR: NONREACTIVE

## 2014-05-08 LAB — PARANEOPLASTIC PROFILE 1: Neuronal Nuclear (Hu) Antibody (IB): <1:10 {titer}

## 2014-05-08 LAB — METHYLMALONIC ACID, SERUM: METHYLMALONIC ACID: 163 nmol/L (ref 0–378)

## 2014-05-08 LAB — VITAMIN E: Vitamin E (Alpha Tocopherol): 16.3 mg/L (ref 4.6–17.8)

## 2014-05-08 NOTE — Telephone Encounter (Signed)
Wells Guiles from [facility] calling to get patient's results over the phone, please return her call and advise.

## 2014-05-08 NOTE — Telephone Encounter (Signed)
Patient calling to speak with Dr. Jaynee Eagles, wants to know if he can have his CT scan at [facility] since he was not happy with how [facility] handled his situation. Please return call and advise as soon as possible.

## 2014-05-08 NOTE — Telephone Encounter (Signed)
Spoke to [facility] MRI referral coordinator. She will contact Wells Guiles @ [facility] to inform of change.  Tried calling patient again, no answer.

## 2014-05-08 NOTE — Telephone Encounter (Signed)
Called to patient to inform Dr. Jaynee Eagles does not mind him changing to [facility]. Will speak with referral coordinator to fwd MRI referral.  No answer.

## 2014-05-09 ENCOUNTER — Telehealth: Payer: Self-pay

## 2014-05-09 LAB — SPECIMEN STATUS REPORT

## 2014-05-09 NOTE — Telephone Encounter (Signed)
Spoke to patient on previous phone note. Closing encounter.

## 2014-05-09 NOTE — Telephone Encounter (Signed)
-----   Message from Melvenia Beam, MD sent at 05/07/2014 11:21 AM EST ----- Please let patient know his labs are normal.

## 2014-05-09 NOTE — Telephone Encounter (Signed)
-----   Message from Melvenia Beam, MD sent at 05/08/2014  4:45 PM EST ----- Please let patient know his labs are all normal.

## 2014-05-09 NOTE — Telephone Encounter (Signed)
Spoke to patient. Gave lab results. Advised MRI coordinator will schedule at [facility]. Patient agreed.

## 2014-05-10 LAB — BASIC METABOLIC PANEL WITH GFR
BUN/Creatinine Ratio: 11 (ref 10–22)
BUN: 11 mg/dL (ref 8–27)
CO2: 20 mmol/L (ref 18–29)
Calcium: 11 mg/dL — ABNORMAL HIGH (ref 8.6–10.2)
Chloride: 100 mmol/L (ref 97–108)
Creatinine, Ser: 0.99 mg/dL (ref 0.76–1.27)
GFR calc Af Amer: 93 mL/min/1.73 (ref >59)
GFR calc non Af Amer: 80 mL/min/1.73 (ref >59)
Glucose: 138 mg/dL — ABNORMAL HIGH (ref 65–99)
Potassium: 4.2 mmol/L (ref 3.5–5.2)
Sodium: 148 mmol/L — ABNORMAL HIGH (ref 134–144)

## 2014-05-10 LAB — SPECIMEN STATUS REPORT

## 2014-05-11 ENCOUNTER — Inpatient Hospital Stay: Admission: RE | Admit: 2014-05-11 | Payer: No Typology Code available for payment source | Source: Ambulatory Visit

## 2014-05-14 ENCOUNTER — Encounter: Payer: Self-pay | Admitting: Family Medicine

## 2014-05-14 ENCOUNTER — Telehealth: Payer: Self-pay | Admitting: Neurology

## 2014-05-14 ENCOUNTER — Ambulatory Visit (INDEPENDENT_AMBULATORY_CARE_PROVIDER_SITE_OTHER): Payer: No Typology Code available for payment source | Admitting: Family Medicine

## 2014-05-14 VITALS — BP 172/98 | HR 109 | Temp 98.7°F | Resp 16 | Ht 69.75 in | Wt 185.6 lb

## 2014-05-14 DIAGNOSIS — I10 Essential (primary) hypertension: Secondary | ICD-10-CM

## 2014-05-14 DIAGNOSIS — G4700 Insomnia, unspecified: Secondary | ICD-10-CM

## 2014-05-14 DIAGNOSIS — G894 Chronic pain syndrome: Secondary | ICD-10-CM

## 2014-05-14 DIAGNOSIS — K219 Gastro-esophageal reflux disease without esophagitis: Secondary | ICD-10-CM

## 2014-05-14 DIAGNOSIS — R739 Hyperglycemia, unspecified: Secondary | ICD-10-CM

## 2014-05-14 LAB — POCT GLYCOSYLATED HEMOGLOBIN (HGB A1C): Hemoglobin A1C: 5.5

## 2014-05-14 LAB — GLUCOSE, POCT (MANUAL RESULT ENTRY): POC Glucose: 120 mg/dl — AB (ref 70–99)

## 2014-05-14 MED ORDER — HYDROCHLOROTHIAZIDE 25 MG PO TABS: 25.0000 mg | ORAL_TABLET | Freq: Every day | ORAL | Status: DC

## 2014-05-14 MED ORDER — ESOMEPRAZOLE MAGNESIUM 20 MG PO CPDR: 20.0000 mg | DELAYED_RELEASE_CAPSULE | Freq: Every day | ORAL | Status: DC

## 2014-05-14 MED ORDER — AMITRIPTYLINE HCL 25 MG PO TABS
50.0000 mg | ORAL_TABLET | Freq: Every evening | ORAL | Status: DC | PRN
Start: 2014-05-14 — End: 2014-09-12

## 2014-05-14 NOTE — Progress Notes (Signed)
Subjective:    Patient ID: Johnny Bryant, male    DOB: [birth year], 63 y.o.   MRN: [id number]  HPI Johnny Bryant is a 63 y.o. male Here for follow up.  See prior ov's. Complicated medical history - see prior notes, including renal mass, vasculitis, chronic pain, HTN.  Suspected Vasculitis with chronic pain and polyarthralgias. Followed by Dr. Estanislado Pandy and then rheum in [place]. Prior on prednisone and methotrexate, but had to self discontinue.planning on different med after renal surgery. Chronic back, neck, L shoulder pain, but with vasculitis, underling rheumatologic cause thought initially. Hx of 3 surgeries to L shoulder, had metal pin in shoulder that migrated to other area - 20 years ago.  Dr. Justin Mend - nephrologist. Did have biopsy of R kidney at time of L nephrectomy and did not indicate indicate vasculitis in kidney. Now on prednisone 10mg  qd, recommendation from rheum was to restart MTX, but he plans to hold off for a little while, and other medicine possibly at that time, but he was concerned that top concern of that other medicine is that it may cause cancer and suppress immune system. Planning on following up with Dr. Estanislado Pandy in next 2 months (seen few weeks ago).   Sent to neurologist - Dr. Jaynee Eagles at [facility], for numb spots on body - fingers, toes numb, then jaw. May be due to vasculitis. Noted to have elevated calcium at 11 recently.  Planning on brain CT scan. (unable to do MRI d/t metal in chest).  Looking into nerve involvement from vasculitis. EMG, NCS performed. Still taking amitryptiline 25mg  - wants to try to increase to 50mg  to help with sleep better. Next appt beginning December.   Referred to pain mgt last ov - With Dr. Mee Hives with [facility] every month. Eliminated tylenol - only taking oxycodone 10mg  tid as needed.  Typically 2 per day. Receiving Rx form Dr. Mee Hives. Stopped Soma, started gabapentin - helps 50%, still occasional cramps.   Renal mass. Left cystic  mass noted on CT 10/2012 Followed by Dr. [name] at [facility]. S/p renal surgery (partial L nephrectomy) August 21st.  Renal cancer noted. Had follow up visit - "cancer free".  Survivor program.  Will be followed medically as part of a study there, every 6 month scans.   Hypertension. Increased Lisinopril to 40 mg QD at office visit prior.  Continued Hydrochlorothiazide (HCTZ) at 12.5 mg QD. Improved at last ov.  Had cardiac eval with Dr. Percival Spanish earlier this year. did fine on a stress test. No missed doses. Home numbers:  155/90 at home. No chest pains.  Lab Results  Component Value Date   CREATININE 0.99 05/03/2014   CREATININE 0.86 01/29/2014   CREATININE 0.73 10/09/2013   Hyperglycemia. Glucose elevated few times prior, but not fasting.  Planned on fasting blood work with Dr. Estanislado Pandy. He has been on prednisone but no known history of diabetes. Prednisone 20mg  QD for vasculitis in past. Glucose 138 on 05/03/14.   Hypercalcemia - see above. Takes otc magnesium and 4 calcium supplements per day. Reading of 11 recently. Marginal bone density in past. Followed by Rheumatology.   Hearburn/GERD - taking omeprazole once each morning, but having heartburn at night about 4 nights per week now. Took pepcid at night - this helps. Not much spicy food. Rare caffeine.   Situational Anxiety.  Has seen a counselor and reports having a good chat considering his medical issues in past.  Feeling better. No recent anxiety.   Insomnia -  still some problems with this.    Patient Active Problem List   Diagnosis Date Noted  . Hereditary and idiopathic peripheral neuropathy 04/25/2014  . Vasculitis 12/25/2013  . Left renal mass 11/09/2013  . Palpitation 10/31/2013  . Sudden hearing loss, unspecified 09/28/2013  . Sensorineural hearing loss, unspecified 09/28/2013  . Chronic pain syndrome 08/18/2013  . Hematuria 08/18/2013  . Heme positive stool 08/18/2013   Past Medical History  Diagnosis Date  .  Hypertension   . Hypercholesteremia   . Arthritis   . Chronic back pain    Past Surgical History  Procedure Laterality Date  . Elbow surgery Right   . Shoulder surgery Left     x 3  . Cosmetic surgery    . Fracture surgery Left     wrist  . Vasectomy     Allergies  Allergen Reactions  . Cymbalta [Duloxetine Hcl] Other (See Comments) and Anxiety    Cranky and moods changes   . Peanut Oil Rash   Prior to Admission medications   Medication Sig Start Date End Date Taking? Authorizing Provider  amitriptyline (ELAVIL) 25 MG tablet Take 1 tablet (25 mg total) by mouth at bedtime. 01/04/14  Yes Theda Sers, PA-C  b complex vitamins tablet Take 1 tablet by mouth daily.   Yes Historical Provider, MD  Calcium Carbonate-Vitamin D (CALCIUM 500 + D) 500-125 MG-UNIT TABS Take 1,000 mg by mouth.    Yes Historical Provider, MD  gabapentin (NEURONTIN) 100 MG capsule Take 100 mg by mouth as needed.  03/15/14  Yes Historical Provider, MD  hydrochlorothiazide (MICROZIDE) 12.5 MG capsule Take 1 capsule (12.5 mg total) by mouth daily. 12/25/13  Yes [name], MD  lisinopril (PRINIVIL,ZESTRIL) 40 MG tablet Take 1 tablet (40 mg total) by mouth daily. 12/25/13  Yes [name], MD  Magnesium Ascorbate POWD 400 mg by Does not apply route 2 (two) times daily.    Yes Historical Provider, MD  Misc Natural Products (TURMERIC CURCUMIN) CAPS Take 1 capsule by mouth daily.   Yes Historical Provider, MD  Multiple Vitamin (MULTIVITAMIN WITH MINERALS) TABS tablet Take 1 tablet by mouth daily.   Yes Historical Provider, MD  omeprazole (PRILOSEC) 20 MG capsule Take 1 capsule (20 mg total) by mouth daily. PATIENT NEEDS OFFICE VISIT FOR ADDITIONAL REFILLS 05/04/14  Yes [name], MD  Oxycodone HCl 10 MG TABS Take 10 mg by mouth 3 (three) times daily.  03/15/14  Yes Historical Provider, MD  Potassium 99 MG TABS Take by mouth daily.    Yes Historical Provider, MD  pravastatin (PRAVACHOL) 20 MG tablet Take  1 tablet (20 mg total) by mouth daily. 12/25/13  Yes [name], MD  predniSONE (DELTASONE) 10 MG tablet Take 10 mg by mouth daily with breakfast.   Yes Historical Provider, MD  zinc gluconate 50 MG tablet Take 50 mg by mouth daily.   Yes Historical Provider, MD  omeprazole (PRILOSEC) 20 MG capsule take 1 capsule by mouth once daily 12/11/13   [name], MD   History   Social History  . Marital Status: Significant Other    Spouse Name: N/A    Number of Children: 2  . Years of Education: N/A   Occupational History  . sales    Social History Main Topics  . Smoking status: Former Smoker    Types: Cigarettes    Quit date: 11/19/1979  . Smokeless tobacco: Never Used  . Alcohol Use: No  .  Drug Use: No  . Sexual Activity: Not on file   Other Topics Concern  . Not on file   Social History Narrative     Review of Systems As above in HPI.     Objective:   Physical Exam  Constitutional: He is oriented to person, place, and time. He appears well-developed and well-nourished.  HENT:  Head: Normocephalic and atraumatic.  Eyes: EOM are normal. Pupils are equal, round, and reactive to light.  Neck: No JVD present. Carotid bruit is not present.  Cardiovascular: Normal rate, regular rhythm and normal heart sounds.   No murmur heard. Pulmonary/Chest: Effort normal and breath sounds normal. He has no rales.  Musculoskeletal: He exhibits no edema.  Neurological: He is alert and oriented to person, place, and time.  Skin: Skin is warm and dry.  Psychiatric: He has a normal mood and affect. His behavior is normal. Judgment and thought content normal.  Vitals reviewed.  Filed Vitals:   05/14/14 1356  BP: 172/98  Pulse: 109  Temp: 98.7 F (37.1 C)  TempSrc: Oral  Resp: 16  Height: 5' 9.75" (1.772 m)  Weight: 185 lb 9.6 oz (84.188 kg)  SpO2: 100%     Results for orders placed or performed in visit on 05/14/14  POCT glucose (manual entry)  Result Value Ref Range    POC Glucose 120 (A) 70 - 99 mg/dl  POCT glycosylated hemoglobin (Hb A1C)  Result Value Ref Range   Hemoglobin A1C 5.5         Assessment & Plan:   Audley Hinojos is a 63 y.o. male Essential hypertension - Plan: hydrochlorothiazide (HYDRODIURIL) 25 MG tablet  - incr HCTZ to 25mg  qd for improved control.. No change in ACE-I dose. Check home BP's, return if elevation persists.   Hyperglycemia - Plan: POCT glucose (manual entry), POCT glycosylated hemoglobin (Hb A1C)  -A1c ok. Continue to intermittently monitor with prednisone use.   Hypercalcemia - Plan: PTH, Intact and Calcium  -Labs pending  Chronic pain syndrome - Plan: amitriptyline (ELAVIL) 25 MG tablet, Insomnia - Plan: amitriptyline (ELAVIL) 25 MG tablet  -continue follow up with pain mgt.  Agreed to try increase of elavil for now for sleep but to discuss this change and any further change with pain mgt.   Gastroesophageal reflux disease, esophagitis presence not specified - Plan: esomeprazole (NEXIUM) 20 MG capsule  -trigger avoidance as below, can try change to Nexium.   Vasculitis - cont follow up with rheumatology and as above, can discuss med options there.   Status post partial nephrectomy for renal CA - doing well and followed by specialist [facility].   Meds ordered this encounter  Medications  . hydrochlorothiazide (HYDRODIURIL) 25 MG tablet    Sig: Take 1 tablet (25 mg total) by mouth daily.    Dispense:  90 tablet    Refill:  3  . amitriptyline (ELAVIL) 25 MG tablet    Sig: Take 2 tablets (50 mg total) by mouth at bedtime as needed for sleep.    Dispense:  60 tablet    Refill:  1  . esomeprazole (NEXIUM) 20 MG capsule    Sig: Take 1 capsule (20 mg total) by mouth daily at 12 noon.    Dispense:  30 capsule    Refill:  2   Patient Instructions  Increase elavil to 2 at bedtime - advise your pain management provider.  Increase hydrochlorothiazide to 25mg  each day.  Change to nexium. Stop omeprazole.  Recheck in  about 6 weeks.  You should receive a call or letter about your lab results within the next week to 10 days.  Return to the clinic or go to the nearest emergency room if any of your symptoms worsen or new symptoms occur.   Food Choices for Gastroesophageal Reflux Disease When you have gastroesophageal reflux disease (GERD), the foods you eat and your eating habits are very important. Choosing the right foods can help ease the discomfort of GERD. WHAT GENERAL GUIDELINES DO I NEED TO FOLLOW?  Choose fruits, vegetables, whole grains, low-fat dairy products, and low-fat meat, fish, and poultry.  Limit fats such as oils, salad dressings, butter, nuts, and avocado.  Keep a food diary to identify foods that cause symptoms.  Avoid foods that cause reflux. These may be different for different people.  Eat frequent small meals instead of three large meals each day.  Eat your meals slowly, in a relaxed setting.  Limit fried foods.  Cook foods using methods other than frying.  Avoid drinking alcohol.  Avoid drinking large amounts of liquids with your meals.  Avoid bending over or lying down until 2-3 hours after eating. WHAT FOODS ARE NOT RECOMMENDED? The following are some foods and drinks that may worsen your symptoms: Vegetables Tomatoes. Tomato juice. Tomato and spaghetti sauce. Chili peppers. Onion and garlic. Horseradish. Fruits Oranges, grapefruit, and lemon (fruit and juice). Meats High-fat meats, fish, and poultry. This includes hot dogs, ribs, ham, sausage, salami, and bacon. Dairy Whole milk and chocolate milk. Sour cream. Cream. Butter. Ice cream. Cream cheese.  Beverages Coffee and tea, with or without caffeine. Carbonated beverages or energy drinks. Condiments Hot sauce. Barbecue sauce.  Sweets/Desserts Chocolate and cocoa. Donuts. Peppermint and spearmint. Fats and Oils High-fat foods, including [place] fries and potato chips. Other Vinegar. Strong spices, such as  black pepper, white pepper, red pepper, cayenne, curry powder, cloves, ginger, and chili powder. The items listed above may not be a complete list of foods and beverages to avoid. Contact your dietitian for more information. Document Released: 06/22/2005 Document Revised: 06/27/2013 Document Reviewed: 04/26/2013 [facility] Patient Information 2015 [place], [place]. This information is not intended to replace advice given to you by your health care provider. Make sure you discuss any questions you have with your health care provider.

## 2014-05-14 NOTE — Telephone Encounter (Signed)
Left patient a message. His calcium is elevated at 11 mg/dl. Should be followed up with pcp. Routing this to Dr. [name] as well. Thank you.

## 2014-05-14 NOTE — Patient Instructions (Signed)
Increase elavil to 2 at bedtime - advise your pain management provider.  Increase hydrochlorothiazide to 25mg  each day.  Change to nexium. Stop omeprazole.  Recheck in about 6 weeks.  You should receive a call or letter about your lab results within the next week to 10 days.  Return to the clinic or go to the nearest emergency room if any of your symptoms worsen or new symptoms occur.   Food Choices for Gastroesophageal Reflux Disease When you have gastroesophageal reflux disease (GERD), the foods you eat and your eating habits are very important. Choosing the right foods can help ease the discomfort of GERD. WHAT GENERAL GUIDELINES DO I NEED TO FOLLOW?  Choose fruits, vegetables, whole grains, low-fat dairy products, and low-fat meat, fish, and poultry.  Limit fats such as oils, salad dressings, butter, nuts, and avocado.  Keep a food diary to identify foods that cause symptoms.  Avoid foods that cause reflux. These may be different for different people.  Eat frequent small meals instead of three large meals each day.  Eat your meals slowly, in a relaxed setting.  Limit fried foods.  Cook foods using methods other than frying.  Avoid drinking alcohol.  Avoid drinking large amounts of liquids with your meals.  Avoid bending over or lying down until 2-3 hours after eating. WHAT FOODS ARE NOT RECOMMENDED? The following are some foods and drinks that may worsen your symptoms: Vegetables Tomatoes. Tomato juice. Tomato and spaghetti sauce. Chili peppers. Onion and garlic. Horseradish. Fruits Oranges, grapefruit, and lemon (fruit and juice). Meats High-fat meats, fish, and poultry. This includes hot dogs, ribs, ham, sausage, salami, and bacon. Dairy Whole milk and chocolate milk. Sour cream. Cream. Butter. Ice cream. Cream cheese.  Beverages Coffee and tea, with or without caffeine. Carbonated beverages or energy drinks. Condiments Hot sauce. Barbecue sauce.   Sweets/Desserts Chocolate and cocoa. Donuts. Peppermint and spearmint. Fats and Oils High-fat foods, including [place] fries and potato chips. Other Vinegar. Strong spices, such as black pepper, white pepper, red pepper, cayenne, curry powder, cloves, ginger, and chili powder. The items listed above may not be a complete list of foods and beverages to avoid. Contact your dietitian for more information. Document Released: 06/22/2005 Document Revised: 06/27/2013 Document Reviewed: 04/26/2013 [facility] Patient Information 2015 [place], [place]. This information is not intended to replace advice given to you by your health care provider. Make sure you discuss any questions you have with your health care provider.

## 2014-05-15 LAB — PTH, INTACT AND CALCIUM
Calcium: 9.5 mg/dL (ref 8.4–10.5)
PTH: 25 pg/mL (ref 14–64)

## 2014-05-22 ENCOUNTER — Telehealth: Payer: Self-pay

## 2014-05-22 MED ORDER — PANTOPRAZOLE SODIUM 40 MG PO TBEC: 40.0000 mg | DELAYED_RELEASE_TABLET | Freq: Every day | ORAL | Status: DC

## 2014-05-22 NOTE — Telephone Encounter (Signed)
Left message on machine to call back  

## 2014-05-22 NOTE — Telephone Encounter (Signed)
Pt.notified

## 2014-05-22 NOTE — Telephone Encounter (Signed)
Please advise patient about prior message and reason for change. Order signed. Let me know if any questions.

## 2014-05-22 NOTE — Telephone Encounter (Signed)
PA was denied for Nexium bc pt has not tried/failed ALL of following: Omeprazole (pt HAS tried this/failed), prevacid OTC, and pantoprazole. Dr Carlota Raspberry, do you want to send in a Rx for pantoprazole? Pended.

## 2014-05-23 ENCOUNTER — Ambulatory Visit (HOSPITAL_COMMUNITY): Payer: No Typology Code available for payment source

## 2014-05-24 ENCOUNTER — Encounter (HOSPITAL_COMMUNITY): Payer: Self-pay

## 2014-05-24 ENCOUNTER — Ambulatory Visit (HOSPITAL_COMMUNITY)
Admission: RE | Admit: 2014-05-24 | Discharge: 2014-05-24 | Disposition: A | Discharge status: Home / Self Care | Payer: No Typology Code available for payment source | Source: Ambulatory Visit | Attending: Neurology | Admitting: Neurology

## 2014-05-24 DIAGNOSIS — I771 Stricture of artery: Secondary | ICD-10-CM | POA: Diagnosis not present

## 2014-05-24 DIAGNOSIS — G509 Disorder of trigeminal nerve, unspecified: Secondary | ICD-10-CM

## 2014-05-24 DIAGNOSIS — I671 Cerebral aneurysm, nonruptured: Secondary | ICD-10-CM | POA: Insufficient documentation

## 2014-05-24 DIAGNOSIS — I776 Arteritis, unspecified: Secondary | ICD-10-CM | POA: Diagnosis present

## 2014-05-24 DIAGNOSIS — H4920 Sixth [abducent] nerve palsy, unspecified eye: Secondary | ICD-10-CM | POA: Diagnosis present

## 2014-05-24 DIAGNOSIS — I728 Aneurysm of other specified arteries: Secondary | ICD-10-CM | POA: Insufficient documentation

## 2014-05-24 DIAGNOSIS — H4923 Sixth [abducent] nerve palsy, bilateral: Secondary | ICD-10-CM

## 2014-05-24 MED ORDER — IOHEXOL 350 MG/ML SOLN
100.0000 mL | Freq: Once | INTRAVENOUS | Status: AC | PRN
  Administered 2014-05-24: 100 mL via INTRAVENOUS

## 2014-05-28 ENCOUNTER — Telehealth: Payer: Self-pay | Admitting: Neurology

## 2014-05-28 ENCOUNTER — Telehealth: Payer: Self-pay

## 2014-05-28 NOTE — Telephone Encounter (Signed)
Dr. Carlota Raspberry- pt does not have any previous orders for the B12 injections. Recent labs for B12 were normal and pt was advised to follow up with you. If needed this could be an "immunization only" visit and pt would not have to wait. We just need the order.

## 2014-05-28 NOTE — Telephone Encounter (Signed)
Pt called in and would like Dr Carlota Raspberry to advise him on how to go about getting a B-12 shot. He states he was just here. He doesn't want to wait for just a shot. Please advise him if he needs an appt. He can be reached @ [phone]. Thank you

## 2014-05-28 NOTE — Telephone Encounter (Signed)
Spoke to patient at length. Explained we found 2 small aneurysms on CTA of the brain, maximum 63mm. Discussed that in general, asymptomatic aneurysms ?7 to 10 mm in diameter warrant consideration for treatment. He was concerned and would like to see a neurosurgeon at [facility] for a consult. I will place the referral.

## 2014-05-29 NOTE — Telephone Encounter (Signed)
Clld pt - [phone]- LMOVM TC on cell if his specialist advised him to have B12 injections.

## 2014-05-29 NOTE — Telephone Encounter (Signed)
B12 level checked by Dr. Jaynee Eagles was normal.  I don't recall a reason for him to have B12 injections with normal B12 level.  If he was told differently by specialist - let me know.

## 2014-06-04 NOTE — Telephone Encounter (Signed)
Lm for rtn call 

## 2014-06-05 ENCOUNTER — Other Ambulatory Visit: Payer: Self-pay | Admitting: Neurology

## 2014-06-05 DIAGNOSIS — I671 Cerebral aneurysm, nonruptured: Secondary | ICD-10-CM

## 2014-06-05 NOTE — Telephone Encounter (Signed)
Lm for rtn call 

## 2014-06-05 NOTE — Telephone Encounter (Signed)
LM for rtn call. 

## 2014-06-05 NOTE — Telephone Encounter (Signed)
Pt has not been advised to get B12 injections. He is suffering from lethargy. He has researched that B12 injections will help with this problem. He would like Dr. Carlota Raspberry to prescribe this.   Pt states that he has tried coffee, other caffeine supplements. He has checked with his RA specialist that states she saw "no reason why he needed one and no reason why it would cause any harm"

## 2014-06-05 NOTE — Telephone Encounter (Signed)
His B12 levels were normal as on October 29th, so B12 deficiency would not be cause of lethargy. No indication for these injections based on this. THe Elavil may be cuasing some sedation or fatigue, as canb his chronic pain and underlying vasculitis. We could try to decrease back to 1 Elavil, but increased this to help with pain.  He could also discuss these options with pain management provider. Can also discuss these symptoms ith neurologist - he may need sleep study if not previously had this.

## 2014-06-05 NOTE — Telephone Encounter (Signed)
Pt called back for Jethro Bolus, please return call

## 2014-06-06 NOTE — Telephone Encounter (Signed)
Lm for rtn call 

## 2014-06-07 NOTE — Telephone Encounter (Signed)
LM for rtn call-   Sent Mychart message to pt with Dr. Vonna Kotyk information.

## 2014-06-14 ENCOUNTER — Telehealth: Payer: Self-pay

## 2014-06-14 NOTE — Telephone Encounter (Signed)
Patient wanted to let Dr Carlota Raspberry know his blood pressure is great. Patients call back number is [phone]

## 2014-06-25 ENCOUNTER — Ambulatory Visit: Payer: No Typology Code available for payment source | Admitting: Family Medicine

## 2014-07-02 ENCOUNTER — Other Ambulatory Visit: Payer: Self-pay | Admitting: Family Medicine

## 2014-08-02 DIAGNOSIS — K219 Gastro-esophageal reflux disease without esophagitis: Secondary | ICD-10-CM | POA: Insufficient documentation

## 2014-08-02 DIAGNOSIS — I10 Essential (primary) hypertension: Secondary | ICD-10-CM | POA: Insufficient documentation

## 2014-08-23 ENCOUNTER — Telehealth: Payer: Self-pay

## 2014-08-23 DIAGNOSIS — K219 Gastro-esophageal reflux disease without esophagitis: Secondary | ICD-10-CM

## 2014-08-23 NOTE — Telephone Encounter (Signed)
Pt of Dr. Carlota Raspberry states that he is getting ulcers in his throat, and needs to switch his antiacids. Dr. Carlota Raspberry did not have any appointments for the remainder of this month, and pt refuses to use the walk-in due to medications weakening his immune system. Would like to know if Dr. Carlota Raspberry can just get him on a different antiacid. Please advise.

## 2014-08-23 NOTE — Telephone Encounter (Signed)
Spoke with pt, he states he went to see an ENT doctor yesterday and he saw ulcers in his throat and it is causing him problems with his voice. We tried to start pt on Nexium on 05/14/2014 but it required an authorization. Since he did not try and fail Protonix, the insurance would not pay for Nexium. He also is concerned about Nexium because he watched a special on [employer] and they stated Nexium causes brain problems and people who use it have a 44% chance of developing Alzheimer's disease. I believe the PA will go through this time because he has failed two medications for his GERD and now it is documented that he has ulcers. Please advise.

## 2014-08-24 NOTE — Telephone Encounter (Signed)
Can we send in a Rx for Nexium? Or should the ENT doctor? I believe the the doctor advised him to call us to change the medication. I apologize if my message was unclear. He also stated that he would take whatever you suggest.

## 2014-08-24 NOTE — Telephone Encounter (Signed)
If the ENT doctor recommended he be on an acid blocker due to ulcers in throat, then would take PPI - at least for now until some healing occurs. I agree that if he does not need to be on a PPI in the future, then could try off that medicine, or using lowest dose or as infrequent doses as necessary for controlling reflux symptoms due to concerns below. This discussion could also be with a gastroenterologist given he has now been diagnosed with ulcerative component, and not sure of risk of discontinuation in this setting.

## 2014-08-25 MED ORDER — ESOMEPRAZOLE MAGNESIUM 20 MG PO CPDR: 20.0000 mg | DELAYED_RELEASE_CAPSULE | Freq: Every day | ORAL | Status: DC

## 2014-08-25 NOTE — Telephone Encounter (Signed)
rx sent in.  Let me know if other info needed from me.

## 2014-08-27 MED ORDER — ESOMEPRAZOLE MAGNESIUM 20 MG PO CPDR: 20.0000 mg | DELAYED_RELEASE_CAPSULE | Freq: Every day | ORAL | Status: DC

## 2014-08-27 NOTE — Telephone Encounter (Signed)
Spoke with pt, he is willing to try Nexium. I had to re-send the Rx to [facility]. He did not want to use his mail order pharmacy.

## 2014-09-12 ENCOUNTER — Other Ambulatory Visit: Payer: Self-pay | Admitting: Family Medicine

## 2014-09-28 DIAGNOSIS — R634 Abnormal weight loss: Secondary | ICD-10-CM | POA: Insufficient documentation

## 2014-09-28 DIAGNOSIS — H9190 Unspecified hearing loss, unspecified ear: Secondary | ICD-10-CM | POA: Insufficient documentation

## 2014-12-16 ENCOUNTER — Other Ambulatory Visit: Payer: Self-pay | Admitting: Family Medicine

## 2014-12-29 ENCOUNTER — Other Ambulatory Visit: Payer: Self-pay | Admitting: Physician Assistant

## 2015-01-24 ENCOUNTER — Ambulatory Visit (INDEPENDENT_AMBULATORY_CARE_PROVIDER_SITE_OTHER): Payer: No Typology Code available for payment source | Admitting: Family Medicine

## 2015-01-24 VITALS — BP 136/90 | HR 108 | Temp 98.6°F | Resp 16 | Ht 69.5 in | Wt 188.6 lb

## 2015-01-24 DIAGNOSIS — R000 Tachycardia, unspecified: Secondary | ICD-10-CM | POA: Diagnosis not present

## 2015-01-24 DIAGNOSIS — I10 Essential (primary) hypertension: Secondary | ICD-10-CM

## 2015-01-24 DIAGNOSIS — K2100 Gastro-esophageal reflux disease with esophagitis, without bleeding: Secondary | ICD-10-CM

## 2015-01-24 DIAGNOSIS — G4700 Insomnia, unspecified: Secondary | ICD-10-CM

## 2015-01-24 DIAGNOSIS — K219 Gastro-esophageal reflux disease without esophagitis: Secondary | ICD-10-CM

## 2015-01-24 DIAGNOSIS — E291 Testicular hypofunction: Secondary | ICD-10-CM

## 2015-01-24 DIAGNOSIS — R002 Palpitations: Secondary | ICD-10-CM

## 2015-01-24 DIAGNOSIS — I776 Arteritis, unspecified: Secondary | ICD-10-CM | POA: Diagnosis not present

## 2015-01-24 DIAGNOSIS — R7989 Other specified abnormal findings of blood chemistry: Secondary | ICD-10-CM

## 2015-01-24 DIAGNOSIS — E785 Hyperlipidemia, unspecified: Secondary | ICD-10-CM

## 2015-01-24 MED ORDER — HYDROCHLOROTHIAZIDE 25 MG PO TABS: 25.0000 mg | ORAL_TABLET | Freq: Every day | ORAL | Status: DC

## 2015-01-24 MED ORDER — METOPROLOL TARTRATE 25 MG PO TABS: 12.5000 mg | ORAL_TABLET | Freq: Two times a day (BID) | ORAL | Status: DC

## 2015-01-24 MED ORDER — PRAVASTATIN SODIUM 20 MG PO TABS: ORAL_TABLET | ORAL | Status: DC

## 2015-01-24 MED ORDER — LISINOPRIL 40 MG PO TABS: ORAL_TABLET | ORAL | Status: DC

## 2015-01-24 MED ORDER — ESOMEPRAZOLE MAGNESIUM 20 MG PO CPDR: 20.0000 mg | DELAYED_RELEASE_CAPSULE | Freq: Every day | ORAL | Status: DC

## 2015-01-24 MED ORDER — AMITRIPTYLINE HCL 25 MG PO TABS: 50.0000 mg | ORAL_TABLET | Freq: Every evening | ORAL | Status: DC | PRN

## 2015-01-24 NOTE — Progress Notes (Addendum)
Subjective:  This chart was scribed for Johnny Parish, MD by Leandra Kern, Medical Scribe. This patient was seen in Room 11 and the patient's care was started at 4:28 PM.   Patient ID: Johnny Bryant, male    DOB: [birth year], 64 y.o.   MRN: [id number]  HPI HPI Comments: Johnny Bryant is a 64 y.o. male who presents to [facility] for a follow up.   GERD/ Ulcers- in throat per previous notes visible with ENT in February of this year. Previous visit in Nov 2015 reflux approximately 4 night per week, was taking pepcid at that time and omeprazole, change nexium. Pt notes that nexium does not give him much relief. Pt notes that he was advised to take 2 dosages of nexium one at morning and one at night by ENT specialist, and has been doing this for 6 months. Pt notes that he did feel relief initially, however in the past 2-3 weeks he started noticing voice change, more heartburn symptoms.   HTN- blood pressure elevated at last visit 172/88, increase HCTZ 25 mg per day. Last renal function here was normal in Oct 2015.  He has had blood work done at [facility], but unsure when most recent testing was done. Pt notes that the medication change from last time did make a difference, however blood pressure did not go as low as needed. Pt notes that his blood pressure runs around 751'W systolic, the lowest he has had it was today in 258 systolic. His blood pressure has been elevated at his other medical visits as well. Pt's HR is elevated today. However he denies shortness of breath. He reports symptoms of palpitations, however he notes that his palpitations have lessened recently. He notes that he feels it when his heart flutters at times,, however cardiologist Dr. Percival Spanish recommended no change in medication, evaluated for these symptoms,  and noted that the problem was not to worry about. He had a Holter monitor in May 11 that showed rare atrial tachycardia. Normal TSH in 01/2014. Had two small  cerebral aneurysm last year approximately 4 mm, but was told that unless her 7 mm no change in treatment or intervention needed. He'll be followed at [facility] for this problem.   Vasculitis- see prior notes. History of chronic pain and polyarthralgias followed by rheumatology in [facility] and Dr. Estanislado Pandy. See prior notes for med changes. He is followed by Dr. Mee Hives with [facility] for pain management. Takes gabapentin for pain-200 mg 3-4 times a day. Today pt is followed by rheumatology at [facility]. Pt still taking prednisone 7 mg per day, restarted methotrexate about three months again, started with 4 cc, and is now up to 8 cc per week by injection.  Today Pt still take 200 mg 3-4 times a day. Pt notes that he was perscribed testosterone 2 mg a day three weeks ago, and he has been compliant with it.   Insomnia - He was prescribed Elavil. Increased to 50 mg last visit. He reports a relief with this dosing.  He notes   HLD- no recent lipid on file but takes pravachol 20 mg once at night time. No new side effects.  Hyperglycemia- A1C normal but elevated glucose in past. Weight at last visit was 105 lbs, weight today is 108 lbs. he has been asked about diabetes at other offices, but has not been diagnosed with this.  Renal Mass- Has a history of a renal mass left sides notes on Ct in  April 2014. Partial left nephrectomy Aug 21 of last year. Renal Cancer was notes, and is still followed by nephrology at [facility]. Pt is cancer free as of May. Pt has a PSA was low and normal prostate exam by his specialist in May.   Low testosterone - diagnosed by a specialist at [facility]. Recently started approximately 3 weeks ago on testosterone supplementation. He is unaware of the dose and will call with that information.  Apparently he discussed use of Viagra with that provider who recommended he ask me for a prescription. Again he is only been on testosterone past 3 weeks.  Lab Results  Component Value Date   HGBA1C 5.5  05/14/2014     Patient Active Problem List   Diagnosis Date Noted  . Hereditary and idiopathic peripheral neuropathy 04/25/2014  . Vasculitis 12/25/2013  . Left renal mass 11/09/2013  . Palpitation 10/31/2013  . Sudden hearing loss, unspecified 09/28/2013  . Sensorineural hearing loss, unspecified 09/28/2013  . Chronic pain syndrome 08/18/2013  . Hematuria 08/18/2013  . Heme positive stool 08/18/2013   Past Medical History  Diagnosis Date  . Hypertension   . Hypercholesteremia   . Arthritis   . Chronic back pain   . Cancer    Past Surgical History  Procedure Laterality Date  . Elbow surgery Right   . Shoulder surgery Left     x 3  . Cosmetic surgery    . Fracture surgery Left     wrist  . Vasectomy     Allergies  Allergen Reactions  . Cymbalta [Duloxetine Hcl] Other (See Comments) and Anxiety    Cranky and moods changes   . Peanut Oil Rash   Prior to Admission medications   Medication Sig Start Date End Date Taking? Authorizing Provider  amitriptyline (ELAVIL) 25 MG tablet Take 2 tablets (50 mg total) by mouth at bedtime as needed for sleep. PATIENT NEEDS OFFICE VISIT FOR ADDITIONAL REFILLS 12/18/14  Yes Jaynee Eagles, PA-C  b complex vitamins tablet Take 1 tablet by mouth daily.   Yes Historical Provider, MD  Calcium Carbonate-Vitamin D (CALCIUM 500 + D) 500-125 MG-UNIT TABS Take 1,000 mg by mouth.    Yes Historical Provider, MD  esomeprazole (NEXIUM) 20 MG capsule Take 1 capsule (20 mg total) by mouth daily at 12 noon. 08/27/14  Yes [name], MD  gabapentin (NEURONTIN) 100 MG capsule Take 100 mg by mouth as needed.  03/15/14  Yes Historical Provider, MD  hydrochlorothiazide (MICROZIDE) 12.5 MG capsule TAKE 1 CAPSULE DAILY. "OV NEEDED" 12/30/14  Yes Chelle Jeffery, PA-C  lisinopril (PRINIVIL,ZESTRIL) 40 MG tablet TAKE 1 TABLET DAILY.  "OV NEEDED" 12/30/14  Yes Chelle Jeffery, PA-C  Magnesium Ascorbate POWD 400 mg by Does not apply route 2 (two) times daily.    Yes  Historical Provider, MD  methotrexate (50 MG/ML) 1 G injection Inject into the vein once. Takes 8 cc weekly   Yes Historical Provider, MD  Misc Natural Products (TURMERIC CURCUMIN) CAPS Take 1 capsule by mouth daily.   Yes Historical Provider, MD  Multiple Vitamin (MULTIVITAMIN WITH MINERALS) TABS tablet Take 1 tablet by mouth daily.   Yes Historical Provider, MD  Oxycodone HCl 10 MG TABS Take 10 mg by mouth 3 (three) times daily.  03/15/14  Yes Historical Provider, MD  Potassium 99 MG TABS Take by mouth daily.    Yes Historical Provider, MD  pravastatin (PRAVACHOL) 20 MG tablet TAKE 1 TABLET DAILY.  "OV NEEDED" 12/30/14  Yes Harrison Mons,  PA-C  predniSONE (DELTASONE) 10 MG tablet Take 10 mg by mouth daily with breakfast.   Yes Historical Provider, MD  pantoprazole (PROTONIX) 40 MG tablet Take 1 tablet (40 mg total) by mouth daily. Patient not taking: Reported on 01/24/2015 05/22/14   [name], MD  zinc gluconate 50 MG tablet Take 50 mg by mouth daily.    Historical Provider, MD   History   Social History  . Marital Status: Significant Other    Spouse Name: N/A  . Number of Children: 2  . Years of Education: N/A   Occupational History  . sales    Social History Main Topics  . Smoking status: Former Smoker    Types: Cigarettes    Quit date: 11/19/1979  . Smokeless tobacco: Never Used  . Alcohol Use: No  . Drug Use: No  . Sexual Activity: Not on file   Other Topics Concern  . Not on file   Social History Narrative    Review of Systems  Respiratory: Negative for shortness of breath.   Cardiovascular: Positive for palpitations.       Objective:   Physical Exam  Constitutional: He is oriented to person, place, and time. He appears well-developed and well-nourished. No distress.  HENT:  Head: Normocephalic and atraumatic.  Eyes: EOM are normal. Pupils are equal, round, and reactive to light.  Neck: Neck supple. No JVD present. Carotid bruit is not present.    Cardiovascular: Normal rate, regular rhythm and normal heart sounds.   No murmur heard. Pulmonary/Chest: Effort normal and breath sounds normal. He has no rales.  Musculoskeletal: He exhibits no edema (in the lower extremities).  Neurological: He is alert and oriented to person, place, and time. No cranial nerve deficit.  Skin: Skin is warm and dry. Rash (faint diffuse mottled appearing rash in the lower extrimities) noted.  Psychiatric: He has a normal mood and affect. His behavior is normal.  Nursing note and vitals reviewed.  BP 136/90 mmHg  Pulse 132  Temp(Src) 98.6 F (37 C) (Oral)  Resp 16  Ht 5' 9.5" (1.765 m)  Wt 188 lb 9.6 oz (85.548 kg)  BMI 27.46 kg/m2  SpO2 99%  Ekg:  Sinus tachycardia, occasional PAC, no apparent acute findings otherwise.     Assessment & Plan:   Johnny Bryant is a 64 y.o. male Gastroesophageal reflux disease with esophagitis - Plan: Ambulatory referral to Gastroenterology  -Persistent, worsening. Refer to GI. Trigger avoidance per AVS. Continue Nexium twice a day.  Essential hypertension - Plan: Lipid panel, COMPLETE METABOLIC PANEL WITH GFR, metoprolol tartrate (LOPRESSOR) 25 MG tablet, lisinopril (PRINIVIL,ZESTRIL) 40 MG tablet, hydrochlorothiazide (HYDRODIURIL) 25 MG tablet  -Uncontrolled by outside readings. Add metoprolol 12.5 mg twice a day. Consider change to long-acting Toprol if he tolerates this dose. Bring record of Valcyte blood pressures at next visit.  Tachycardia/Palpitations - Plan: EKG 12-Lead - Plan: TSH, EKG 12-Lead  -Sinus tach, with PACs.  Has been evaluated by cardiology the past and his palpitations of actually lessened. If symptoms increase or any chest pain would have him follow back up with cardiology.  ER/RTC precautions.  Vasculitis  - Followed by rheumatology at [facility]. Now on both prednisone and methotrexate but decreasing prednisone dose as increasing methotrexate. Continue routine follow-up with his specialist at  [facility].  Hyperlipidemia - Plan: Lipid panel, COMPLETE METABOLIC PANEL WITH GFR, pravastatin (PRAVACHOL) 20 MG tablet  -No recent lipid panel, but not fasting in office today. Return next few days for fasting  blood work.  Insomnia - Plan: amitriptyline (ELAVIL) 25 MG tablet  -Tolerating current dose of Elavil 50 mg daily at bedtime. Refilled at this dose.  Low testosterone  -Recent diagnosis by his other provider [facility]. He is now on testosterone supplementation but only for 3 weeks, so recommended not starting Viagra if this time. If his symptoms persist the next visit, consider starting low-dose Viagra after discussion of risks and benefits.  Follow-up is planned in October as scheduled visit.  Meds ordered this encounter  Medications  . methotrexate (50 MG/ML) 1 G injection    Sig: Inject into the vein once. Takes 8 cc weekly  . metoprolol tartrate (LOPRESSOR) 25 MG tablet    Sig: Take 0.5 tablets (12.5 mg total) by mouth 2 (two) times daily.    Dispense:  180 tablet    Refill:  1  . pravastatin (PRAVACHOL) 20 MG tablet    Sig: TAKE 1 TABLET DAILY.    Dispense:  90 tablet    Refill:  1  . lisinopril (PRINIVIL,ZESTRIL) 40 MG tablet    Sig: TAKE 1 TABLET DAILY.    Dispense:  90 tablet    Refill:  1  . amitriptyline (ELAVIL) 25 MG tablet    Sig: Take 2 tablets (50 mg total) by mouth at bedtime as needed for sleep.    Dispense:  180 tablet    Refill:  2  . esomeprazole (NEXIUM) 20 MG capsule    Sig: Take 1 capsule (20 mg total) by mouth daily at 12 noon.    Dispense:  60 capsule    Refill:  2  . hydrochlorothiazide (HYDRODIURIL) 25 MG tablet    Sig: Take 1 tablet (25 mg total) by mouth daily.    Dispense:  90 tablet    Refill:  1   Patient Instructions  For your blood pressure, I changed to the 25 mg pill; need take 1 per day of HCTZ. Continue same dose of lisinopril, but I added metoprolol one half pill twice per day.Keep a record of your blood pressures outside of the office and  bring them to the next office visit.  I refilled cholesterol medication, but return for fasting blood work in the next few days.You should receive a call or letter about your lab results within the next week to 10 days.   I refer you to gastroenterology for your heartburn. Continue Nexium twice per day for now avoid foods below that may worsen the symptoms.  Before we fill Viagra, like to see if the testosterone will help her symptoms initially. We can discuss this further next visit if symptoms have persisted, and can discuss risks of medications like Viagra at that time.  Keep follow-up with your specialists at [facility]. I would advise him that the Nexium you are taking may affect the levels of methotrexate. Make sure they know that you are on Nexium as this may affect that medication.  Food Choices for Gastroesophageal Reflux Disease When you have gastroesophageal reflux disease (GERD), the foods you eat and your eating habits are very important. Choosing the right foods can help ease the discomfort of GERD. WHAT GENERAL GUIDELINES DO I NEED TO FOLLOW?  Choose fruits, vegetables, whole grains, low-fat dairy products, and low-fat meat, fish, and poultry.  Limit fats such as oils, salad dressings, butter, nuts, and avocado.  Keep a food diary to identify foods that cause symptoms.  Avoid foods that cause reflux. These may be different for different people.  Eat frequent  small meals instead of three large meals each day.  Eat your meals slowly, in a relaxed setting.  Limit fried foods.  Cook foods using methods other than frying.  Avoid drinking alcohol.  Avoid drinking large amounts of liquids with your meals.  Avoid bending over or lying down until 2-3 hours after eating. WHAT FOODS ARE NOT RECOMMENDED? The following are some foods and drinks that may worsen your symptoms: Vegetables Tomatoes. Tomato juice. Tomato and spaghetti sauce. Chili peppers. Onion and garlic.  Horseradish. Fruits Oranges, grapefruit, and lemon (fruit and juice). Meats High-fat meats, fish, and poultry. This includes hot dogs, ribs, ham, sausage, salami, and bacon. Dairy Whole milk and chocolate milk. Sour cream. Cream. Butter. Ice cream. Cream cheese.  Beverages Coffee and tea, with or without caffeine. Carbonated beverages or energy drinks. Condiments Hot sauce. Barbecue sauce.  Sweets/Desserts Chocolate and cocoa. Donuts. Peppermint and spearmint. Fats and Oils High-fat foods, including [place] fries and potato chips. Other Vinegar. Strong spices, such as black pepper, white pepper, red pepper, cayenne, curry powder, cloves, ginger, and chili powder. The items listed above may not be a complete list of foods and beverages to avoid. Contact your dietitian for more information. Document Released: 06/22/2005 Document Revised: 06/27/2013 Document Reviewed: 04/26/2013 [facility] Patient Information 2015 [place], [place]. This information is not intended to replace advice given to you by your health care provider. Make sure you discuss any questions you have with your health care provider.       I personally performed the services described in this documentation, which was scribed in my presence. The recorded information has been reviewed and considered, and addended by me as needed.

## 2015-01-24 NOTE — Patient Instructions (Signed)
For your blood pressure, I changed to the 25 mg pill; need take 1 per day of HCTZ. Continue same dose of lisinopril, but I added metoprolol one half pill twice per day.Keep a record of your blood pressures outside of the office and bring them to the next office visit.  I refilled cholesterol medication, but return for fasting blood work in the next few days.You should receive a call or letter about your lab results within the next week to 10 days.   I refer you to gastroenterology for your heartburn. Continue Nexium twice per day for now avoid foods below that may worsen the symptoms.  Before we fill Viagra, like to see if the testosterone will help her symptoms initially. We can discuss this further next visit if symptoms have persisted, and can discuss risks of medications like Viagra at that time.  Keep follow-up with your specialists at [facility]. I would advise him that the Nexium you are taking may affect the levels of methotrexate. Make sure they know that you are on Nexium as this may affect that medication.  Food Choices for Gastroesophageal Reflux Disease When you have gastroesophageal reflux disease (GERD), the foods you eat and your eating habits are very important. Choosing the right foods can help ease the discomfort of GERD. WHAT GENERAL GUIDELINES DO I NEED TO FOLLOW?  Choose fruits, vegetables, whole grains, low-fat dairy products, and low-fat meat, fish, and poultry.  Limit fats such as oils, salad dressings, butter, nuts, and avocado.  Keep a food diary to identify foods that cause symptoms.  Avoid foods that cause reflux. These may be different for different people.  Eat frequent small meals instead of three large meals each day.  Eat your meals slowly, in a relaxed setting.  Limit fried foods.  Cook foods using methods other than frying.  Avoid drinking alcohol.  Avoid drinking large amounts of liquids with your meals.  Avoid bending over or lying down until 2-3 hours  after eating. WHAT FOODS ARE NOT RECOMMENDED? The following are some foods and drinks that may worsen your symptoms: Vegetables Tomatoes. Tomato juice. Tomato and spaghetti sauce. Chili peppers. Onion and garlic. Horseradish. Fruits Oranges, grapefruit, and lemon (fruit and juice). Meats High-fat meats, fish, and poultry. This includes hot dogs, ribs, ham, sausage, salami, and bacon. Dairy Whole milk and chocolate milk. Sour cream. Cream. Butter. Ice cream. Cream cheese.  Beverages Coffee and tea, with or without caffeine. Carbonated beverages or energy drinks. Condiments Hot sauce. Barbecue sauce.  Sweets/Desserts Chocolate and cocoa. Donuts. Peppermint and spearmint. Fats and Oils High-fat foods, including [place] fries and potato chips. Other Vinegar. Strong spices, such as black pepper, white pepper, red pepper, cayenne, curry powder, cloves, ginger, and chili powder. The items listed above may not be a complete list of foods and beverages to avoid. Contact your dietitian for more information. Document Released: 06/22/2005 Document Revised: 06/27/2013 Document Reviewed: 04/26/2013 [employer] Patient Information 2015 [place], [place]. This information is not intended to replace advice given to you by your health care provider. Make sure you discuss any questions you have with your health care provider.

## 2015-01-28 ENCOUNTER — Other Ambulatory Visit: Payer: Self-pay | Admitting: Urgent Care

## 2015-01-28 DIAGNOSIS — G4700 Insomnia, unspecified: Secondary | ICD-10-CM

## 2015-01-29 NOTE — Telephone Encounter (Signed)
Please advise on refill.

## 2015-01-29 NOTE — Telephone Encounter (Signed)
It is unclear if patient filled his script for Elavil on 01/24/2015 which is the last appointment he had with Dr. Carlota Raspberry. Apparently, he was rx 180 pills and there is no way he would have used them all in 5 days. Please clarify with patient if he filled his script from 01/24/2015. Thank you!

## 2015-01-29 NOTE — Telephone Encounter (Signed)
Dr Carlota Raspberry  Patient is completely out of his sleep medicine.  Request from surescripts is in.   States he saw you last month.

## 2015-01-31 MED ORDER — AMITRIPTYLINE HCL 25 MG PO TABS: 50.0000 mg | ORAL_TABLET | Freq: Every evening | ORAL | Status: DC | PRN

## 2015-01-31 NOTE — Telephone Encounter (Signed)
Sent in 1 mos Rx per protocol and called pt to advise.

## 2015-01-31 NOTE — Telephone Encounter (Signed)
Pt wants amitriptyline (ELAVIL) 25 MG tablet [[id number]]  sent to [facility] on [address]. He is out of his pills and [facility] won't have it ready for a week. He can't sleep at night without them. Please advise at [phone]

## 2015-02-20 ENCOUNTER — Encounter: Payer: Self-pay | Admitting: Gastroenterology

## 2015-02-20 ENCOUNTER — Ambulatory Visit (INDEPENDENT_AMBULATORY_CARE_PROVIDER_SITE_OTHER): Payer: No Typology Code available for payment source | Admitting: Gastroenterology

## 2015-02-20 VITALS — BP 156/98 | HR 90 | Ht 69.5 in | Wt 188.0 lb

## 2015-02-20 DIAGNOSIS — K219 Gastro-esophageal reflux disease without esophagitis: Secondary | ICD-10-CM

## 2015-02-20 DIAGNOSIS — R1013 Epigastric pain: Secondary | ICD-10-CM | POA: Diagnosis not present

## 2015-02-20 DIAGNOSIS — R490 Dysphonia: Secondary | ICD-10-CM

## 2015-02-20 MED ORDER — DEXLANSOPRAZOLE 60 MG PO CPDR: 60.0000 mg | DELAYED_RELEASE_CAPSULE | Freq: Every day | ORAL | Status: DC

## 2015-02-20 NOTE — Patient Instructions (Addendum)
Today your blood pressure was elevated. Please follow up with your Primary Care Provider for blood pressure management.  You have been scheduled for an endoscopy. Please follow written instructions given to you at your visit today. If you use inhalers (even only as needed), please bring them with you on the day of your procedure. Your physician has requested that you go to www.startemmi.com and enter the access code given to you at your visit today. This web site gives a general overview about your procedure. However, you should still follow specific instructions given to you by our office regarding your preparation for the procedure.  Please purchase the following medications over the counter and take as directed: Zantac or Pepcid as needed  If you have not heard from your mail in pharmacy within 1 week or if you have not received your medication in the mail, please contact us at [phone] so we may find out why. Dexilant 60mg 

## 2015-02-20 NOTE — Progress Notes (Signed)
02/20/2015 Johnny Bryant [id number] 13-Feb-1951   HISTORY OF PRESENT ILLNESS:  This is a pleasant 64 year old male who is previously known to Dr. [name] for colonoscopy in May 2010 at which time he was only found to have moderate diverticulosis in the sigmoid colon with repeat exam recommended in 10 years from that time. He presents to our office today with complaints of horrible reflux and burning in his esophagus as well as hoarseness of his voice that has been constant. He says that he is taking Nexium 20 mg twice daily. He saw an ENT physician who did a laryngoscopy and said that he had irritation of his vocal cords and voice box. He describes burning discomfort and regurgitation of food at nighttime despite taking the Nexium. He is on prednisone for the past year and a half and that is being tapered slowly, now at 6 mg daily.  He is taking the prednisone for vasculitis.  He asked me to make note that he has always been hard to sedate with both fentanyl and Versed as well as propofol. I did advise him to speak with the anesthesia staff about this as well.  Past Medical History  Diagnosis Date  . Hypertension   . Hypercholesteremia   . Arthritis   . Chronic back pain   . Cancer     kidney   Past Surgical History  Procedure Laterality Date  . Elbow surgery Right   . Shoulder surgery Left     x 3  . Cosmetic surgery    . Fracture surgery Left     wrist  . Vasectomy      reports that he quit smoking about 35 years ago. His smoking use included Cigarettes. He has never used smokeless tobacco. He reports that he does not drink alcohol or use illicit drugs. family history includes Cancer in his paternal grandmother; Diabetes in his father; Heart attack (age of onset: 76) in his maternal grandfather; Heart attack (age of onset: 19) in his paternal grandfather; Heart disease (age of onset: 42) in his father; Hyperlipidemia in his father and mother; Hypertension in his father and mother;  Stroke in his mother. Allergies  Allergen Reactions  . Cymbalta [Duloxetine Hcl] Other (See Comments) and Anxiety    Cranky and moods changes   . Peanut Oil Rash      Outpatient Encounter Prescriptions as of 02/20/2015  Medication Sig  . amitriptyline (ELAVIL) 25 MG tablet Take 2 tablets (50 mg total) by mouth at bedtime as needed for sleep.  Marland Kitchen b complex vitamins tablet Take 1 tablet by mouth daily.  . Calcium Carbonate-Vitamin D (CALCIUM 500 + D) 500-125 MG-UNIT TABS Take 1,000 mg by mouth.   . esomeprazole (NEXIUM) 20 MG capsule Take 1 capsule (20 mg total) by mouth daily at 12 noon.  . gabapentin (NEURONTIN) 100 MG capsule Take 300 mg by mouth as needed.   . hydrochlorothiazide (HYDRODIURIL) 25 MG tablet Take 1 tablet (25 mg total) by mouth daily.  Marland Kitchen lisinopril (PRINIVIL,ZESTRIL) 40 MG tablet TAKE 1 TABLET DAILY.  . Magnesium Ascorbate POWD 400 mg by Does not apply route 2 (two) times daily.   . methotrexate (50 MG/ML) 1 G injection Inject into the vein once. Takes 8 cc weekly  . metoprolol tartrate (LOPRESSOR) 25 MG tablet Take 0.5 tablets (12.5 mg total) by mouth 2 (two) times daily.  . Misc Natural Products (TURMERIC CURCUMIN) CAPS Take 1 capsule by mouth daily.  . Multiple Vitamin (MULTIVITAMIN WITH  MINERALS) TABS tablet Take 1 tablet by mouth daily.  . Oxycodone HCl 10 MG TABS Take 10 mg by mouth 3 (three) times daily.   . Potassium 99 MG TABS Take by mouth daily.   . pravastatin (PRAVACHOL) 20 MG tablet TAKE 1 TABLET DAILY.  Marland Kitchen predniSONE (DELTASONE) 10 MG tablet Take 6 mg by mouth daily with breakfast.   . zinc gluconate 50 MG tablet Take 50 mg by mouth daily.  Marland Kitchen dexlansoprazole (DEXILANT) 60 MG capsule Take 1 capsule (60 mg total) by mouth daily.   No facility-administered encounter medications on file as of 02/20/2015.     REVIEW OF SYSTEMS  : All other systems reviewed and negative except where noted in the History of Present Illness.   PHYSICAL EXAM: BP 156/98 mmHg   Pulse 90  Ht 5' 9.5" (1.765 m)  Wt 188 lb (85.276 kg)  BMI 27.37 kg/m2 General: Well developed white male in no acute distress Head: Normocephalic and atraumatic Eyes:  Sclerae anicteric, conjunctiva pink. Ears: Normal auditory acuity Lungs: Clear throughout to auscultation Heart: Regular rate and rhythm Abdomen: Soft, non-distended.  Normal bowel sounds.  Mild epigastric abdominal TTP without R/R/G. Musculoskeletal: Symmetrical with no gross deformities  Skin: No lesions on visible extremities Extremities: No edema  Neurological: Alert oriented x 4, grossly non-focal Psychological:  Alert and cooperative. Normal mood and affect  ASSESSMENT AND PLAN: -GERD, hoarseness, epigastric pain:  Despite Nexium 20 mg BID.  Will schedule EGD for evaluation.  Will try Dexilant 60 mg daily at dinnertime in place of the Nexium.  Can use Zantac or pepcid a couple of times per day if needed as well.  The risks, benefits, and alternatives to EGD were discussed with the patient and he consents to proceed.    CC:  [name], MD

## 2015-02-20 NOTE — Progress Notes (Signed)
Reviewed and agree with management. Robert D. Kaplan, M.D., FACG  

## 2015-02-25 ENCOUNTER — Telehealth: Payer: Self-pay | Admitting: Gastroenterology

## 2015-02-25 NOTE — Telephone Encounter (Signed)
No

## 2015-02-26 ENCOUNTER — Encounter: Payer: Self-pay | Admitting: Gastroenterology

## 2015-02-26 ENCOUNTER — Encounter: Payer: No Typology Code available for payment source | Admitting: Gastroenterology

## 2015-02-28 DIAGNOSIS — E291 Testicular hypofunction: Secondary | ICD-10-CM | POA: Insufficient documentation

## 2015-03-04 DIAGNOSIS — M791 Myalgia: Secondary | ICD-10-CM | POA: Diagnosis not present

## 2015-03-04 DIAGNOSIS — M62838 Other muscle spasm: Secondary | ICD-10-CM | POA: Diagnosis not present

## 2015-03-04 DIAGNOSIS — Z79899 Other long term (current) drug therapy: Secondary | ICD-10-CM | POA: Diagnosis not present

## 2015-03-07 DIAGNOSIS — E291 Testicular hypofunction: Secondary | ICD-10-CM | POA: Diagnosis not present

## 2015-03-18 ENCOUNTER — Encounter: Payer: Self-pay | Admitting: Gastroenterology

## 2015-03-18 ENCOUNTER — Ambulatory Visit (AMBULATORY_SURGERY_CENTER): Payer: Medicare Other | Admitting: Gastroenterology

## 2015-03-18 VITALS — BP 123/67 | HR 77 | Temp 97.8°F | Resp 20 | Ht 69.5 in | Wt 188.0 lb

## 2015-03-18 DIAGNOSIS — D649 Anemia, unspecified: Secondary | ICD-10-CM | POA: Diagnosis not present

## 2015-03-18 DIAGNOSIS — I10 Essential (primary) hypertension: Secondary | ICD-10-CM | POA: Diagnosis not present

## 2015-03-18 DIAGNOSIS — K219 Gastro-esophageal reflux disease without esophagitis: Secondary | ICD-10-CM | POA: Diagnosis present

## 2015-03-18 MED ORDER — SODIUM CHLORIDE 0.9 % IV SOLN: 500.0000 mL | INTRAVENOUS | Status: DC

## 2015-03-18 MED ORDER — DEXLANSOPRAZOLE 60 MG PO CPDR: 60.0000 mg | DELAYED_RELEASE_CAPSULE | Freq: Every day | ORAL | Status: DC

## 2015-03-18 NOTE — Progress Notes (Signed)
Transferred to recovery room. A/O x3, pleased with MAC.  VSS.  Report to Penny, RN. 

## 2015-03-18 NOTE — Patient Instructions (Addendum)

## 2015-03-18 NOTE — Op Note (Signed)
[facility]  [address] [place], [id number]   ENDOSCOPY PROCEDURE REPORT  PATIENT: Johnny Bryant, Johnny Bryant  MR#: [id number] BIRTHDATE: [birth year] , 58  yrs. old GENDER: male ENDOSCOPIST: Inda Castle, MD REFERRED BY:  [name], M.D. PROCEDURE DATE:  03/18/2015 PROCEDURE:  EGD, diagnostic ASA CLASS:     Class II INDICATIONS:  heartburn. MEDICATIONS: Monitored anesthesia care and Propofol 170 mg IV TOPICAL ANESTHETIC:  DESCRIPTION OF PROCEDURE: After the risks benefits and alternatives of the procedure were thoroughly explained, informed consent was obtained.  The LB [id number] endoscope was introduced through the mouth and advanced to the second portion of the duodenum , Without limitations.  The instrument was slowly withdrawn as the mucosa was fully examined.      EXAM: The esophagus and gastroesophageal junction were completely normal in appearance.  The stomach was entered and closely examined.The antrum, angularis, and lesser curvature were well visualized, including a retroflexed view of the cardia and fundus. The stomach wall was normally distensable.  The scope passed easily through the pylorus into the duodenum.  Retroflexed views revealed no abnormalities.     The scope was then withdrawn from the patient and the procedure completed.  COMPLICATIONS: There were no immediate complications.  ENDOSCOPIC IMPRESSION: Normal appearing esophagus and GE junction, the stomach was well visualized and normal in appearance, normal appearing duodenum  RECOMMENDATIONS: Begin dexilant 60 mg daily  REPEAT EXAM:  eSigned:  Inda Castle, MD 03/18/2015 8:24 AM    CC:

## 2015-03-19 ENCOUNTER — Telehealth: Payer: Self-pay

## 2015-03-19 ENCOUNTER — Telehealth: Payer: Self-pay | Admitting: Gastroenterology

## 2015-03-19 NOTE — Telephone Encounter (Signed)
Left message for patient that [place] form has been received since his appointment, NO PHONE CALL messages about a prior Josem Kaufmann has been received,. L/M that he will need to contact his pharmacy to rerun the medication and have them fax over a prior auth rejection form to [phone]. At that time we will do this request

## 2015-03-19 NOTE — Telephone Encounter (Signed)
Called #[phone] and answering machine picked up, I left a message to call us back if any questions or concerns. maw

## 2015-03-28 DIAGNOSIS — M62838 Other muscle spasm: Secondary | ICD-10-CM | POA: Diagnosis not present

## 2015-03-28 DIAGNOSIS — Z79899 Other long term (current) drug therapy: Secondary | ICD-10-CM | POA: Diagnosis not present

## 2015-03-28 DIAGNOSIS — M791 Myalgia: Secondary | ICD-10-CM | POA: Diagnosis not present

## 2015-04-07 IMAGING — US US RENAL
1 series · 14 of 25 positions shown · non-contrast
Comparison: CT abdomen pelvis dated 10/13/2013

CLINICAL DATA: Cystic left renal mass

EXAM:
RENAL/URINARY TRACT ULTRASOUND COMPLETE

[Series 1: us renal · 0.27mm/px · 14 of 47 slices shown]
[im 1/47]
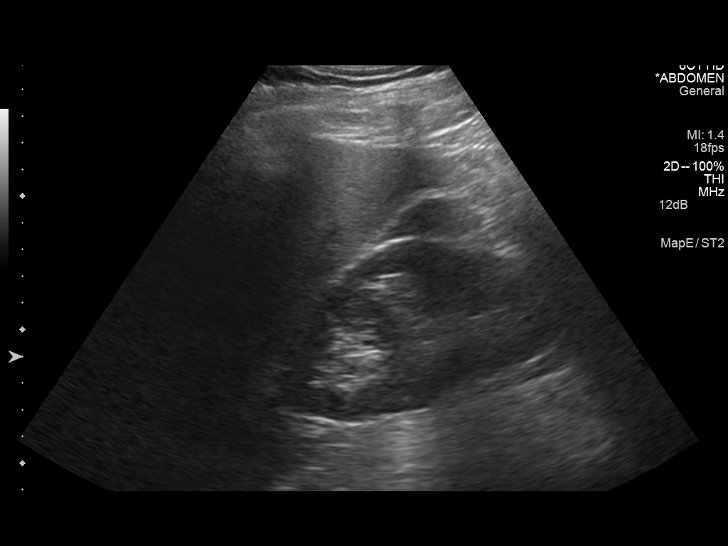
[im 4/47]
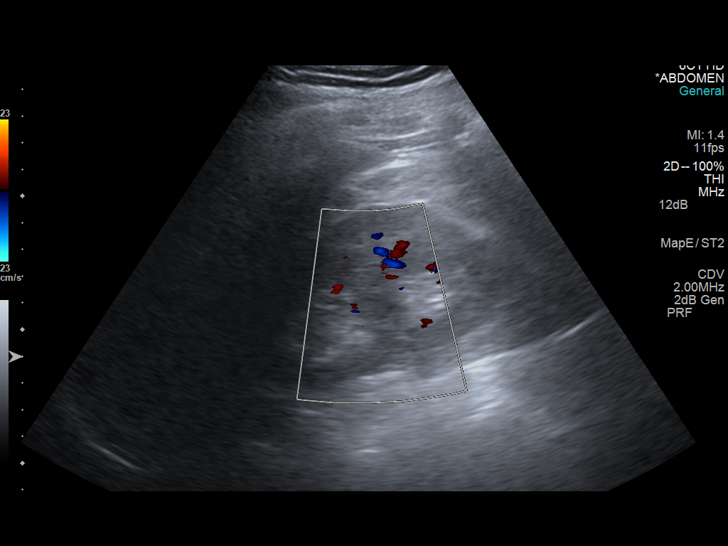
[im 8/47]
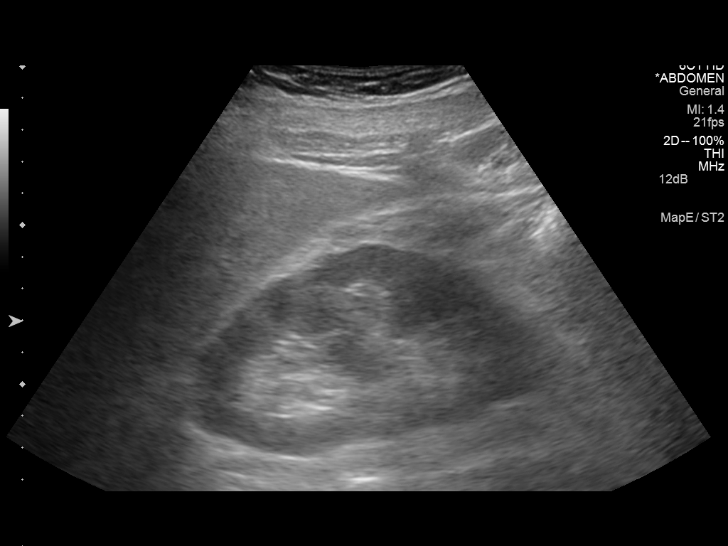
[im 12/47]
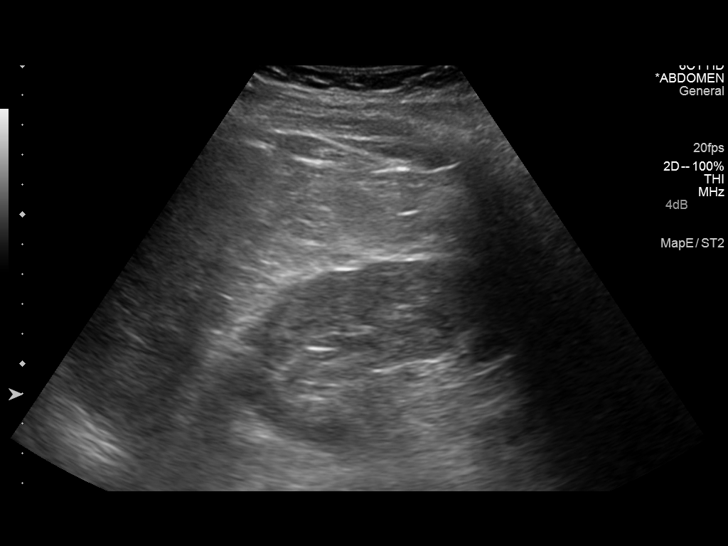
[im 16/47]
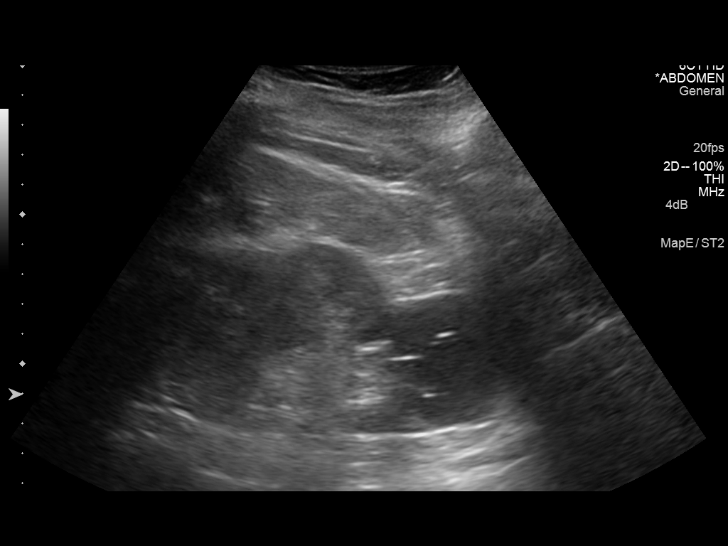
[im 18/47]
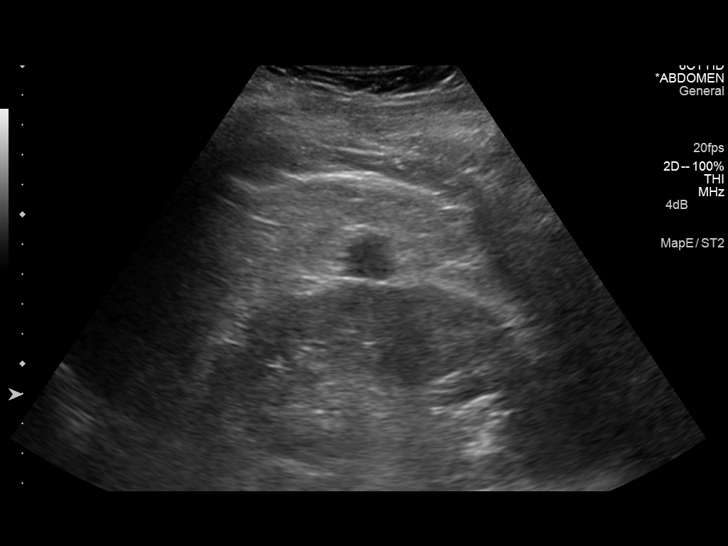
[im 22/47]
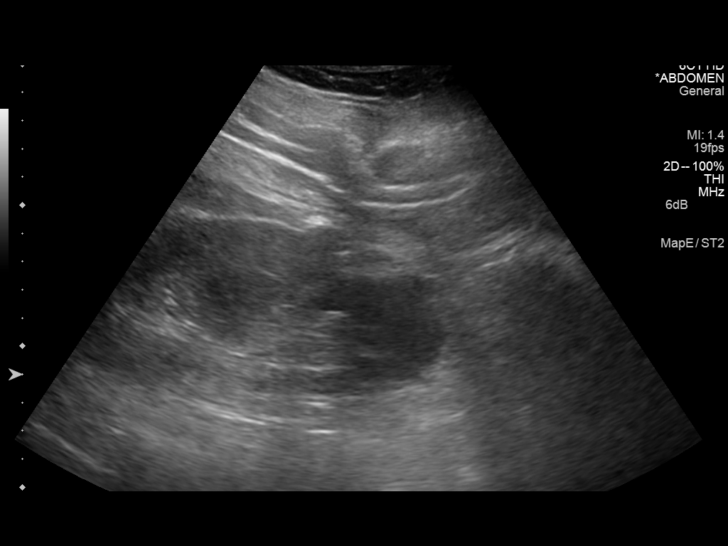
[im 25/47]
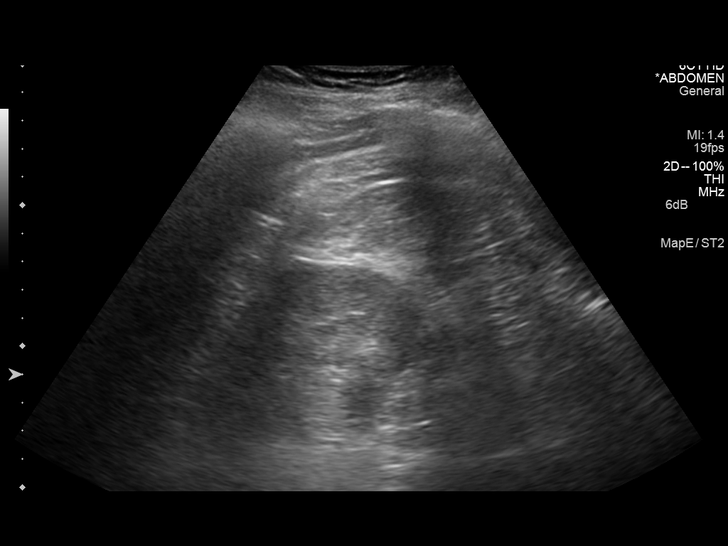
[im 29/47]
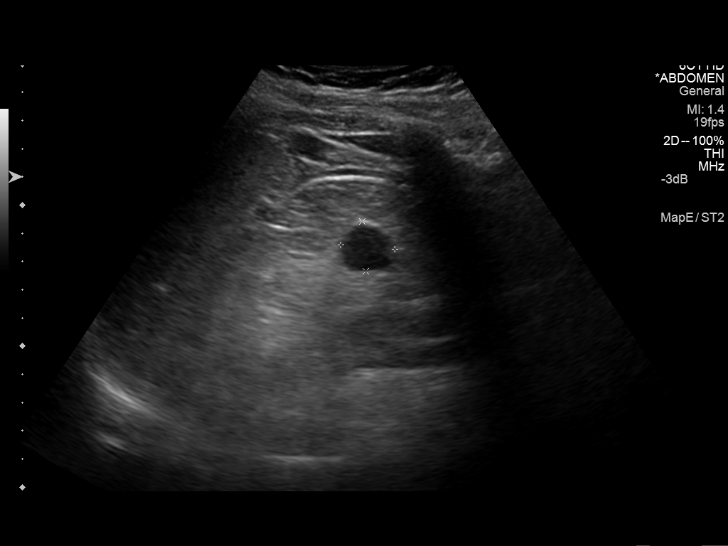
[im 31/47]
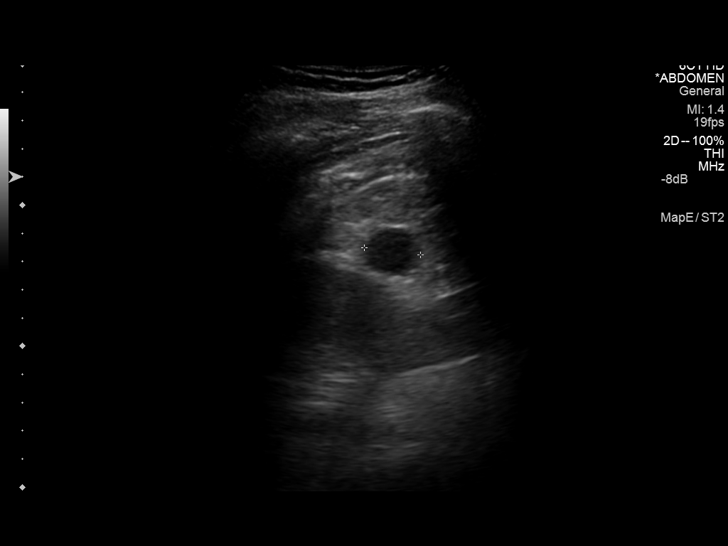
[im 35/47]
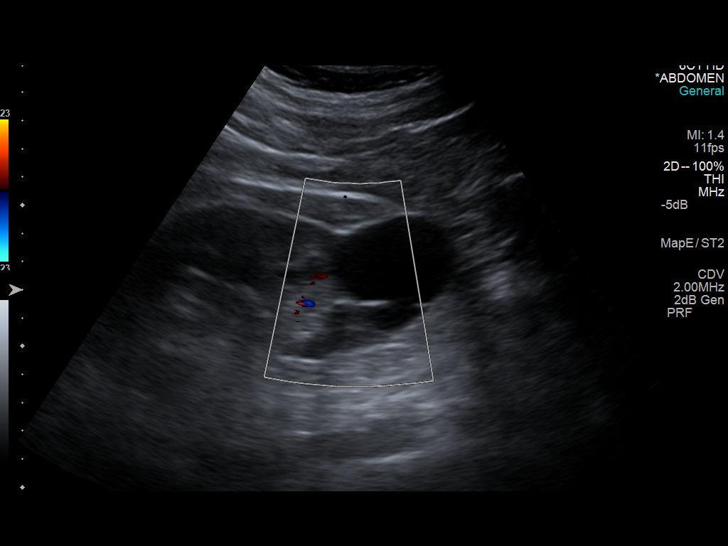
[im 39/47]
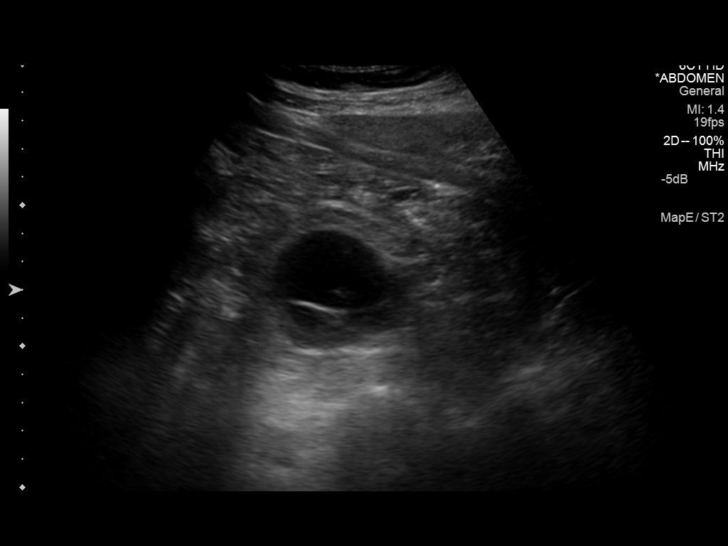
[im 43/47]
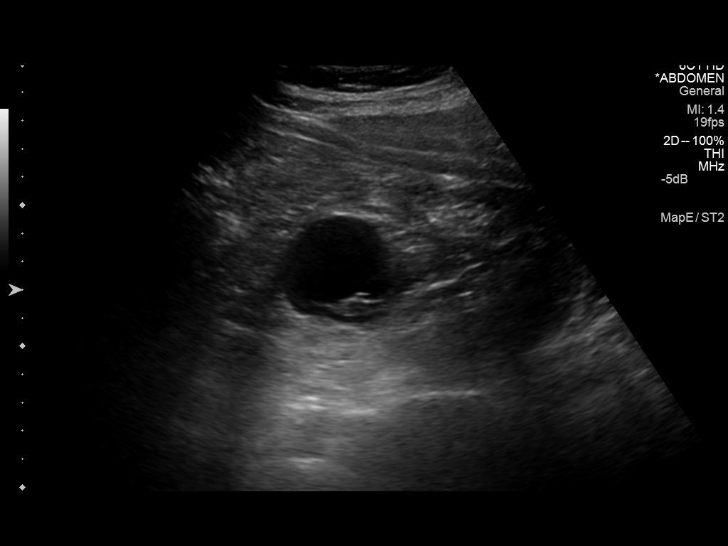
[im 47/47]
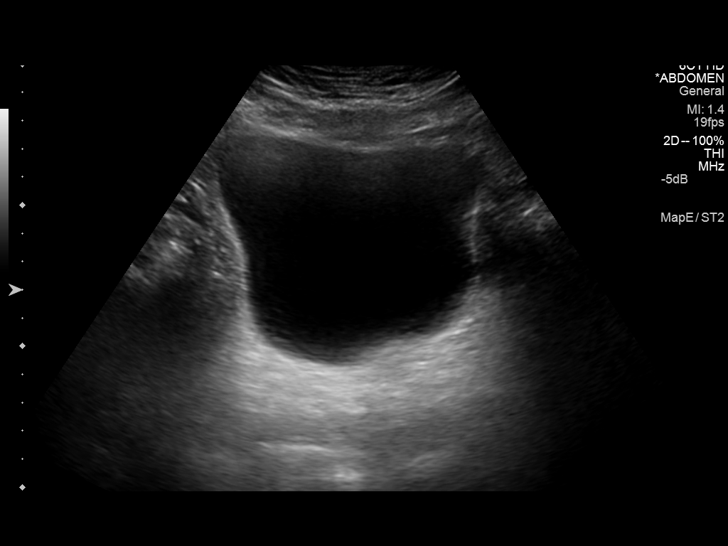

[14 of 25 positions shown; findings below may reference images not displayed]

FINDINGS: Right Kidney:

Length: 11.6 cm.  No mass or hydronephrosis.

Left Kidney:

Length: 12.0 cm. 1.9 x 1.8 x 2.0 cm interpolar cyst, possibly mildly
complicated by hemorrhage. 5.0 x 4.0 x 3.8 cm complex lower pole
cystic mass with suspected layering hemorrhage and/or soft tissue.

Bladder:

Within normal limits.
IMPRESSION: 5.0 cm complex left lower pole cystic mass with suspected layering
hemorrhage and/or soft tissue. Given suspected enhancement on CT,
this remains a Bosniak III lesion. MRI abdomen with/without contrast
is suggested for further evaluation.

Additional 2.0 cm left interpolar cyst, possibly mildly complicated
by hemorrhage, likely benign.

## 2015-04-24 ENCOUNTER — Encounter: Payer: Self-pay | Admitting: Gastroenterology

## 2015-04-26 DIAGNOSIS — Z79899 Other long term (current) drug therapy: Secondary | ICD-10-CM | POA: Diagnosis not present

## 2015-04-26 DIAGNOSIS — M791 Myalgia: Secondary | ICD-10-CM | POA: Diagnosis not present

## 2015-04-26 DIAGNOSIS — M62838 Other muscle spasm: Secondary | ICD-10-CM | POA: Diagnosis not present

## 2015-05-27 ENCOUNTER — Ambulatory Visit: Payer: No Typology Code available for payment source | Admitting: Gastroenterology

## 2015-05-27 DIAGNOSIS — Z79899 Other long term (current) drug therapy: Secondary | ICD-10-CM | POA: Diagnosis not present

## 2015-05-27 DIAGNOSIS — M791 Myalgia: Secondary | ICD-10-CM | POA: Diagnosis not present

## 2015-05-27 DIAGNOSIS — M62838 Other muscle spasm: Secondary | ICD-10-CM | POA: Diagnosis not present

## 2015-06-06 DIAGNOSIS — I10 Essential (primary) hypertension: Secondary | ICD-10-CM | POA: Diagnosis not present

## 2015-06-06 DIAGNOSIS — Z888 Allergy status to other drugs, medicaments and biological substances status: Secondary | ICD-10-CM | POA: Diagnosis not present

## 2015-06-06 DIAGNOSIS — Z87891 Personal history of nicotine dependence: Secondary | ICD-10-CM | POA: Diagnosis not present

## 2015-06-06 DIAGNOSIS — K219 Gastro-esophageal reflux disease without esophagitis: Secondary | ICD-10-CM | POA: Diagnosis not present

## 2015-06-06 DIAGNOSIS — R490 Dysphonia: Secondary | ICD-10-CM | POA: Diagnosis not present

## 2015-06-06 DIAGNOSIS — L538 Other specified erythematous conditions: Secondary | ICD-10-CM | POA: Diagnosis not present

## 2015-06-06 DIAGNOSIS — Z79899 Other long term (current) drug therapy: Secondary | ICD-10-CM | POA: Diagnosis not present

## 2015-06-06 DIAGNOSIS — I776 Arteritis, unspecified: Secondary | ICD-10-CM | POA: Diagnosis not present

## 2015-06-06 DIAGNOSIS — M2550 Pain in unspecified joint: Secondary | ICD-10-CM | POA: Diagnosis not present

## 2015-06-06 DIAGNOSIS — M1990 Unspecified osteoarthritis, unspecified site: Secondary | ICD-10-CM | POA: Diagnosis not present

## 2015-06-06 DIAGNOSIS — R231 Pallor: Secondary | ICD-10-CM | POA: Diagnosis not present

## 2015-06-06 DIAGNOSIS — Z85528 Personal history of other malignant neoplasm of kidney: Secondary | ICD-10-CM | POA: Diagnosis not present

## 2015-06-06 DIAGNOSIS — E785 Hyperlipidemia, unspecified: Secondary | ICD-10-CM | POA: Diagnosis not present

## 2015-06-06 DIAGNOSIS — R21 Rash and other nonspecific skin eruption: Secondary | ICD-10-CM | POA: Diagnosis not present

## 2015-06-06 DIAGNOSIS — M5431 Sciatica, right side: Secondary | ICD-10-CM | POA: Diagnosis not present

## 2015-06-06 DIAGNOSIS — Z7952 Long term (current) use of systemic steroids: Secondary | ICD-10-CM | POA: Diagnosis not present

## 2015-06-06 DIAGNOSIS — Z7982 Long term (current) use of aspirin: Secondary | ICD-10-CM | POA: Diagnosis not present

## 2015-06-06 DIAGNOSIS — Z905 Acquired absence of kidney: Secondary | ICD-10-CM | POA: Diagnosis not present

## 2015-06-06 DIAGNOSIS — G479 Sleep disorder, unspecified: Secondary | ICD-10-CM | POA: Diagnosis not present

## 2015-06-06 DIAGNOSIS — R5383 Other fatigue: Secondary | ICD-10-CM | POA: Diagnosis not present

## 2015-06-06 DIAGNOSIS — L853 Xerosis cutis: Secondary | ICD-10-CM | POA: Diagnosis not present

## 2015-06-06 DIAGNOSIS — H903 Sensorineural hearing loss, bilateral: Secondary | ICD-10-CM | POA: Diagnosis not present

## 2015-06-06 DIAGNOSIS — M2540 Effusion, unspecified joint: Secondary | ICD-10-CM | POA: Diagnosis not present

## 2015-06-06 DIAGNOSIS — Z794 Long term (current) use of insulin: Secondary | ICD-10-CM | POA: Diagnosis not present

## 2015-06-06 DIAGNOSIS — R000 Tachycardia, unspecified: Secondary | ICD-10-CM | POA: Diagnosis not present

## 2015-06-24 DIAGNOSIS — E291 Testicular hypofunction: Secondary | ICD-10-CM | POA: Diagnosis not present

## 2015-06-24 DIAGNOSIS — C642 Malignant neoplasm of left kidney, except renal pelvis: Secondary | ICD-10-CM | POA: Diagnosis not present

## 2015-06-26 DIAGNOSIS — M62838 Other muscle spasm: Secondary | ICD-10-CM | POA: Diagnosis not present

## 2015-06-26 DIAGNOSIS — M791 Myalgia: Secondary | ICD-10-CM | POA: Diagnosis not present

## 2015-06-26 DIAGNOSIS — Z79899 Other long term (current) drug therapy: Secondary | ICD-10-CM | POA: Diagnosis not present

## 2015-06-28 ENCOUNTER — Ambulatory Visit: Payer: No Typology Code available for payment source | Admitting: Gastroenterology

## 2015-07-25 DIAGNOSIS — M791 Myalgia: Secondary | ICD-10-CM | POA: Diagnosis not present

## 2015-07-25 DIAGNOSIS — M62838 Other muscle spasm: Secondary | ICD-10-CM | POA: Diagnosis not present

## 2015-07-25 DIAGNOSIS — Z79899 Other long term (current) drug therapy: Secondary | ICD-10-CM | POA: Diagnosis not present

## 2015-08-22 DIAGNOSIS — Z79899 Other long term (current) drug therapy: Secondary | ICD-10-CM | POA: Diagnosis not present

## 2015-08-22 DIAGNOSIS — M62838 Other muscle spasm: Secondary | ICD-10-CM | POA: Diagnosis not present

## 2015-08-22 DIAGNOSIS — M791 Myalgia: Secondary | ICD-10-CM | POA: Diagnosis not present

## 2015-09-04 ENCOUNTER — Encounter: Payer: Self-pay | Admitting: Family Medicine

## 2015-09-04 ENCOUNTER — Ambulatory Visit (INDEPENDENT_AMBULATORY_CARE_PROVIDER_SITE_OTHER): Payer: Medicare Other | Admitting: Family Medicine

## 2015-09-04 VITALS — BP 130/62 | HR 81 | Temp 99.3°F | Resp 16 | Ht 69.25 in | Wt 185.8 lb

## 2015-09-04 DIAGNOSIS — E785 Hyperlipidemia, unspecified: Secondary | ICD-10-CM | POA: Diagnosis not present

## 2015-09-04 DIAGNOSIS — L301 Dyshidrosis [pompholyx]: Secondary | ICD-10-CM | POA: Diagnosis not present

## 2015-09-04 DIAGNOSIS — I10 Essential (primary) hypertension: Secondary | ICD-10-CM | POA: Diagnosis not present

## 2015-09-04 DIAGNOSIS — R490 Dysphonia: Secondary | ICD-10-CM

## 2015-09-04 DIAGNOSIS — K219 Gastro-esophageal reflux disease without esophagitis: Secondary | ICD-10-CM | POA: Diagnosis not present

## 2015-09-04 DIAGNOSIS — L989 Disorder of the skin and subcutaneous tissue, unspecified: Secondary | ICD-10-CM | POA: Diagnosis not present

## 2015-09-04 MED ORDER — TRIAMCINOLONE ACETONIDE 0.1 % EX CREA: 1.0000 "application " | TOPICAL_CREAM | Freq: Two times a day (BID) | CUTANEOUS | Status: AC

## 2015-09-04 MED ORDER — HYDROCHLOROTHIAZIDE 25 MG PO TABS: 25.0000 mg | ORAL_TABLET | Freq: Every day | ORAL | Status: DC

## 2015-09-04 MED ORDER — METOPROLOL TARTRATE 25 MG PO TABS: 12.5000 mg | ORAL_TABLET | Freq: Two times a day (BID) | ORAL | Status: DC

## 2015-09-04 MED ORDER — LISINOPRIL 40 MG PO TABS: ORAL_TABLET | ORAL | Status: DC

## 2015-09-04 MED ORDER — ESOMEPRAZOLE MAGNESIUM 40 MG PO CPDR: 40.0000 mg | DELAYED_RELEASE_CAPSULE | Freq: Every day | ORAL | Status: DC

## 2015-09-04 NOTE — Progress Notes (Signed)
Subjective:    Patient ID: Johnny Bryant, male    DOB: [birth year], 65 y.o.   MRN: [id number] By signing my name below, I, Zola Button, attest that this documentation has been prepared under the direction and in the presence of [name], MD.  Electronically Signed: Zola Button, Medical Scribe. 09/04/2015. 5:10 PM.  HPI HPI Comments: Johnny Bryant is a 65 y.o. male who presents to the [facility] for a follow-up. History of GERD, hypertension, vasculitis, chronic pain syndrome, and renal cancer. Last visit with me in July 2016.  GERD: Seen by Dr. [name]. Most recent endoscopy September 2016. Started on Dexilant 60 mg qd. Normal endoscopy. Last office visit with Dr. [name] was in August 2016. Did complain of hoarseness at that time and burning in his esophagus. Was taking Nexium 20 mg BID. Reportedly had seen ENT that had noted irritation of vocal cords and voice box on laryngoscopy towards the end of 2016. At our last visit, he had been seen by ENT in February 2016. Patient states that Dr. [name] left the practice suddenly and that he never received the [place]. He will follow-up at the same practice, but he has not yet established with another provider since Dr. [name] left. Patient is taking Nexium twice a day currently.   Hypertension: As above, he had a left nephrectomy August 2015 for renal cancer. He has been evaluated by Dr. Percival Spanish for palpitations with rare atrial tachycardia on Holter monitor. I did do a lab only order July of last year, but that had not been drawn for his thyroid test, cholesterol, and CMP. We did add metoprolol 12.5 mg BID at July visit due to elevated readings. Continued him on lisinopril 40 mg and HCTZ 25 mg. Patient notes his blood pressure has been below [id number] systolic. He denies chest pain, SOB, dizziness, and lightheadedness. Lab Results  Component Value Date   CREATININE 0.99 05/03/2014    Hyperlipidemia: He was continued on Pravachol 20 mg  qd and plan for fasting lipid panel. This has not yet been done. Last cholesterol in May 2016 at [facility] showed total cholesterol 216 and LDL 119. Normal AST and normal ALT in December 2016 at [facility]. Patient agrees to come by within the next 2 weeks to have fasting blood work drawn. Borderline TSH July 2016 at 0.490, but normal free T4 and free T3.  Vasculitis: Patient has mostly been dealing with fatigue due to vasculitis. He is under the care of a rheumatologist at [facility].  Rash: Patient has a pruritic, discolored area on his lower back that he first noticed over a year ago. He believes the area has been increasing in size. He also reports having similar areas to his hands bilaterally.   Patient Active Problem List   Diagnosis Date Noted  . 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 Esophageal reflux 02/20/2015  . Hoarseness 02/20/2015  . Abdominal pain, epigastric 02/20/2015  . Difficulty hearing 09/28/2014  . Abnormal weight loss 09/28/2014  . Essential (primary) hypertension 08/02/2014  . Gastro-esophageal reflux disease without esophagitis 08/02/2014  . Hereditary and idiopathic peripheral neuropathy 04/25/2014  . Vasculitis ([place]) 12/25/2013  . Left renal mass 11/09/2013  . Palpitation 10/31/2013  . Sudden hearing loss, unspecified 09/28/2013  . Sensorineural hearing loss, unspecified 09/28/2013  . Deafness, sensorineural 09/28/2013  . Chronic pain syndrome 08/18/2013  . Hematuria 08/18/2013  . Heme positive stool 08/18/2013   Past Medical History  Diagnosis Date  . Hypertension   . Hypercholesteremia   .  Chronic back pain   . Cancer ([place])     kidney  . Arthritis   . GERD (gastroesophageal reflux disease)   . Vasculitis [facility])    Past Surgical History  Procedure Laterality Date  . Elbow surgery Right   . Shoulder surgery Left     x 3  . Cosmetic surgery    . Fracture surgery Left     wrist  . Vasectomy    . Colonoscopy     Allergies  Allergen Reactions  . Cymbalta [Duloxetine Hcl]  Other (See Comments) and Anxiety    Cranky and moods changes   . Peanut Oil Rash   Prior to Admission medications   Medication Sig Start Date End Date Taking? Authorizing Provider  amitriptyline (ELAVIL) 25 MG tablet Take 2 tablets (50 mg total) by mouth at bedtime as needed for sleep. 01/31/15   Chelle Jeffery, PA-C  b complex vitamins tablet Take 1 tablet by mouth daily.    Historical Provider, MD  Calcium Carbonate-Vitamin D (CALCIUM 500 + D) 500-125 MG-UNIT TABS Take 1,000 mg by mouth.     Historical Provider, MD  dexlansoprazole (DEXILANT) 60 MG capsule Take 1 capsule (60 mg total) by mouth daily. 03/18/15   Inda Castle, MD  gabapentin (NEURONTIN) 100 MG capsule Take 300 mg by mouth as needed.  03/15/14   Historical Provider, MD  hydrochlorothiazide (HYDRODIURIL) 25 MG tablet Take 1 tablet (25 mg total) by mouth daily. 01/24/15   [name], MD  lisinopril (PRINIVIL,ZESTRIL) 40 MG tablet TAKE 1 TABLET DAILY. 01/24/15   [name], MD  Magnesium Ascorbate POWD 400 mg by Does not apply route 2 (two) times daily.     Historical Provider, MD  methotrexate (50 MG/ML) 1 G injection Inject into the vein once. Takes 8 cc weekly    Historical Provider, MD  metoprolol tartrate (LOPRESSOR) 25 MG tablet Take 0.5 tablets (12.5 mg total) by mouth 2 (two) times daily. 01/24/15   [name], MD  Misc Natural Products (TURMERIC CURCUMIN) CAPS Take 1 capsule by mouth daily.    Historical Provider, MD  Multiple Vitamin (MULTIVITAMIN WITH MINERALS) TABS tablet Take 1 tablet by mouth daily.    Historical Provider, MD  Oxycodone HCl 10 MG TABS Take 10 mg by mouth 3 (three) times daily.  03/15/14   Historical Provider, MD  Potassium 99 MG TABS Take by mouth daily.     Historical Provider, MD  pravastatin (PRAVACHOL) 20 MG tablet TAKE 1 TABLET DAILY. 01/24/15   [name], MD  predniSONE (DELTASONE) 10 MG tablet Take 6 mg by mouth daily with breakfast.     Historical Provider, MD  zinc  gluconate 50 MG tablet Take 50 mg by mouth daily.    Historical Provider, MD   Social History   Social History  . Marital Status: Significant Other    Spouse Name: N/A  . Number of Children: 2  . Years of Education: N/A   Occupational History  . sales    Social History Main Topics  . Smoking status: Former Smoker    Types: Cigarettes    Quit date: 11/19/1979  . Smokeless tobacco: Never Used  . Alcohol Use: No  . Drug Use: No  . Sexual Activity: Not on file   Other Topics Concern  . Not on file   Social History Narrative     Review of Systems  Constitutional: Positive for fatigue. Negative for unexpected weight change.  HENT: Positive  for voice change.   Eyes: Negative for visual disturbance.  Respiratory: Negative for cough, chest tightness and shortness of breath.   Cardiovascular: Negative for chest pain, palpitations and leg swelling.  Gastrointestinal: Negative for abdominal pain and blood in stool.  Skin: Positive for color change and rash.  Neurological: Negative for dizziness, light-headedness and headaches.       Objective:   Physical Exam  Constitutional: He is oriented to person, place, and time. He appears well-developed and well-nourished.  HENT:  Head: Normocephalic and atraumatic.  Eyes: EOM are normal. Pupils are equal, round, and reactive to light.  Neck: No JVD present. Carotid bruit is not present.  Cardiovascular: Normal rate, regular rhythm and normal heart sounds.   No murmur heard. Pulmonary/Chest: Effort normal and breath sounds normal. He has no rales.  Musculoskeletal: He exhibits no edema.  Neurological: He is alert and oriented to person, place, and time.  Skin: Skin is warm and dry. Rash noted. No erythema.  Lower back: Slightly hyperpigmented, dry appearing patch, approximately 1 cm x 5 mm. No surrounding erythema.  Slightly dry, cracked, scaly areas on palmar aspects, left greater than right hand.  Psychiatric: He has a normal mood  and affect.  Vitals reviewed.   Filed Vitals:   09/04/15 1619  BP: 130/62  Pulse: 81  Temp: 99.3 F (37.4 C)  TempSrc: Oral  Resp: 16  Height: 5' 9.25" (1.759 m)  Weight: 185 lb 12.8 oz (84.278 kg)  SpO2: 98%         Assessment & Plan:   Camaren Tai is a 65 y.o. male Essential hypertension - Plan: hydrochlorothiazide (HYDRODIURIL) 25 MG tablet, lisinopril (PRINIVIL,ZESTRIL) 40 MG tablet, metoprolol tartrate (LOPRESSOR) 25 MG tablet  -stable. meds refilled  Gastroesophageal reflux disease, esophagitis presence not specified - Plan: Ambulatory referral to Gastroenterology, esomeprazole ([place]) 40 MG capsule  -refer to new GI as prior provider no longer at practice.   -nexium Rx BID for now, cont H2 blocker as advised by ENT  Hyperlipidemia  -lipids to be drawn this next week with fasting labs.   Hoarseness - Plan: Ambulatory referral to Gastroenterology, esomeprazole (NEXIUM) 40 MG capsule  -as above.  Thought to be reflux related.   Dyshidrotic eczema - Plan: triamcinolone cream (KENALOG) 0.1 %, Ambulatory referral to Dermatology  - triamcinolone BID to start with - may need stronger steroid cream. Derm referral.   Skin lesion of back - Plan: Ambulatory referral to Dermatology  - possible seb keratosis but with itching - refer to derm.   Meds ordered this encounter  Medications  . esomeprazole (NEXIUM) 40 MG capsule    Sig: Take 1 capsule (40 mg total) by mouth daily.    Dispense:  60 capsule    Refill:  3  . triamcinolone cream (KENALOG) 0.1 %    Sig: Apply 1 application topically 2 (two) times daily.    Dispense:  30 g    Refill:  0  . hydrochlorothiazide (HYDRODIURIL) 25 MG tablet    Sig: Take 1 tablet (25 mg total) by mouth daily.    Dispense:  90 tablet    Refill:  1  . lisinopril (PRINIVIL,ZESTRIL) 40 MG tablet    Sig: TAKE 1 TABLET DAILY.    Dispense:  90 tablet    Refill:  1  . metoprolol tartrate (LOPRESSOR) 25 MG tablet    Sig: Take 0.5 tablets  (12.5 mg total) by mouth 2 (two) times daily.    Dispense:  90  tablet    Refill:  1   Patient Instructions  Return for fasting blood work in next week (cholesterol, kidney and liver tests, and thyroid test).   I referred you to gastroenterologist and prescribed nexium in the meantime.   Steroid cream to hands if needed, and refer you to dermatology for hand rash and area on your lower back.  Can apply steroid cream to back area if it itches.     I personally performed the services described in this documentation, which was scribed in my presence. The recorded information has been reviewed and considered, and addended by me as needed.

## 2015-09-04 NOTE — Patient Instructions (Addendum)
Return for fasting blood work in next week (cholesterol, kidney and liver tests, and thyroid test).   I referred you to gastroenterologist and prescribed nexium in the meantime.   Steroid cream to hands if needed, and refer you to dermatology for hand rash and area on your lower back.  Can apply steroid cream to back area if it itches.

## 2015-09-06 ENCOUNTER — Encounter: Payer: Self-pay | Admitting: Gastroenterology

## 2015-09-06 ENCOUNTER — Ambulatory Visit (INDEPENDENT_AMBULATORY_CARE_PROVIDER_SITE_OTHER): Payer: Medicare Other | Admitting: Gastroenterology

## 2015-09-06 VITALS — BP 124/60 | HR 90 | Ht 69.25 in | Wt 188.0 lb

## 2015-09-06 DIAGNOSIS — K219 Gastro-esophageal reflux disease without esophagitis: Secondary | ICD-10-CM | POA: Diagnosis not present

## 2015-09-06 DIAGNOSIS — R490 Dysphonia: Secondary | ICD-10-CM

## 2015-09-06 MED ORDER — DEXLANSOPRAZOLE 60 MG PO CPDR: 60.0000 mg | DELAYED_RELEASE_CAPSULE | Freq: Every day | ORAL | Status: DC

## 2015-09-06 NOTE — Progress Notes (Signed)
[facility] Progress Note  Chief Complaint: GERD and chronic hoarseness  Subjective History:  His return visit for this 65 year old man who previously saw our PA and Dr. [name]. For about 2 years he has chronic hoarseness that seems to be getting somewhat worse. He says an ENT setting was likely due to reflux due to findings on fiberoptic exam. He has been on twice daily Nexium for at least a year and noticed mild improvement. He is also taking Zantac 3 times a day. He was seen by the PA in our clinic in August 2016, and was on a prednisone taper for vasculitis. Now takes methotrexate for that. Shortly after, he had a normal upper endoscopy with Dr. [name], and he was advised to take [place]. Patient reports that he could not afford that, but believes he now may be able to since he is on Medicare. He describes significant nocturnal symptoms including pyrosis and regurgitation. He is a nonsmoker, his last food is at about 5 PM, he sleeps somewhat elevated on 2 pillows. He also has pyrosis multiple times throughout the day. He is frustrated, since he does not understand why he has persistent symptoms despite medicine.  Denies dysphagia ROS: Cardiovascular:  no chest pain Respiratory: no dyspnea  Past Medical History: Past Medical History  Diagnosis Date  . Hypertension   . Hypercholesteremia   . Chronic back pain   . History of kidney cancer     left  . Arthritis   . GERD (gastroesophageal reflux disease)   . Vasculitis ([place])     Objective:  Med list reviewed  Vital signs in last 24 hrs: Filed Vitals:   09/06/15 1148  BP: 124/60  Pulse: 90    Physical Exam   HEENT: sclera anicteric, oral mucosa moist without lesions  Neck: supple, no thyromegaly, JVD or lymphadenopathy  Cardiac: RRR without murmurs, S1S2 heard, no peripheral edema  Pulm: clear to auscultation bilaterally, normal RR and effort noted  Abdomen: soft, no tenderness, with active bowel sounds. No guarding  or palpable hepatosplenomegaly  Skin; warm and dry, no jaundice or rash   Last EGD report reviewed. It appears normal, with no hiatal hernia described  @ASSESSMENTPLANBEGIN @ Assessment: Encounter Diagnoses  Name Primary?  . Gastroesophageal reflux disease without esophagitis Yes  . Chronic hoarseness    The hoarseness appears due to GERD, since there was apparently no structural or functional cause for it on fiberoptic exam, and he has suboptimally controlled GERD symptoms.  We had a long discussion today regarding nonacid reflux and the limitation of acid suppression medicines to control this. He is already engaged in the diet and lifestyle changes but has persistent symptoms. I advised him to undergo pH/impedance and esophageal motility testing to see if he may benefit from fundoplication. After a lengthy discussion, he would prefer a trial of Dexilant before considering that.   Plan:  60 mg once daily instead of Nexium. When prescribing it, there is a notification that there may be a drug interaction with methotrexate. However, this appears to be a class effect, the patient is tolerated twice daily Nexium for quite some time. I think that the short-term benefit of it peers outweigh the risk as long as his CBC and liver enzymes are being monitored by his rheumatologist. He will call us with an update. If he is not significantly improved, further testing will be pursued. Over half of the 25 minute encounter was spent in counseling and coordination of care.   Topics discussed: GERD  and its available treatments and further workup.    [name]

## 2015-09-06 NOTE — Patient Instructions (Addendum)
We have sent the following medications to your pharmacy for you to pick up at your convenience: Dexilant   Stop the Nexuim   Thank you for choosing [facility]   Dr Wilfrid Lund III

## 2015-09-09 ENCOUNTER — Telehealth: Payer: Self-pay

## 2015-09-09 DIAGNOSIS — G4700 Insomnia, unspecified: Secondary | ICD-10-CM

## 2015-09-09 MED ORDER — AMITRIPTYLINE HCL 25 MG PO TABS: 50.0000 mg | ORAL_TABLET | Freq: Every evening | ORAL | Status: DC | PRN

## 2015-09-09 NOTE — Telephone Encounter (Signed)
Rx sent 

## 2015-09-09 NOTE — Telephone Encounter (Signed)
Pt is needing a refill on his amitriptyine with [facility]   Pt has an appt with dr Carlota Raspberry on 03/18/16

## 2015-09-13 DIAGNOSIS — M791 Myalgia: Secondary | ICD-10-CM | POA: Diagnosis not present

## 2015-09-13 DIAGNOSIS — Z79899 Other long term (current) drug therapy: Secondary | ICD-10-CM | POA: Diagnosis not present

## 2015-09-13 DIAGNOSIS — M62838 Other muscle spasm: Secondary | ICD-10-CM | POA: Diagnosis not present

## 2015-10-16 DIAGNOSIS — Z79899 Other long term (current) drug therapy: Secondary | ICD-10-CM | POA: Diagnosis not present

## 2015-10-16 DIAGNOSIS — M791 Myalgia: Secondary | ICD-10-CM | POA: Diagnosis not present

## 2015-10-16 DIAGNOSIS — M62838 Other muscle spasm: Secondary | ICD-10-CM | POA: Diagnosis not present

## 2015-10-29 DIAGNOSIS — L409 Psoriasis, unspecified: Secondary | ICD-10-CM | POA: Diagnosis not present

## 2015-10-29 DIAGNOSIS — L309 Dermatitis, unspecified: Secondary | ICD-10-CM | POA: Diagnosis not present

## 2015-10-29 DIAGNOSIS — L718 Other rosacea: Secondary | ICD-10-CM | POA: Diagnosis not present

## 2015-10-29 IMAGING — CT CT ANGIO HEAD
2 of 7 series · 17 of 47 positions shown · IV contrast (OMNIPAQUE)
Comparison: None.

ADDENDUM:
Salient findings discussed by telephone with Dr. Pavacaj (covering for
Dr. AK MCKEEVER) on 05/24/2014 at [DATE]. I advised to have Dr.
Shahla contact me on 05/24/14 with any questions.
CLINICAL DATA: 63-year-old male with bilateral cranial nerve 5 and
6 palsy. "vasculitis" . Initial encounter.

EXAM:
CT ANGIOGRAPHY HEAD
TECHNIQUE: Multidetector CT imaging of the head was performed using the
standard protocol during bolus administration of intravenous
contrast. Multiplanar CT image reconstructions and MIPs were
obtained to evaluate the vascular anatomy.
CONTRAST:  100mL OMNIPAQUE IOHEXOL 350 MG/ML SOLN

[Series 7: cta head · axial · 0.41mm/px · z∈[+1201,+1319]mm · 15 of 69 slices shown]
[im 5/69  brain]
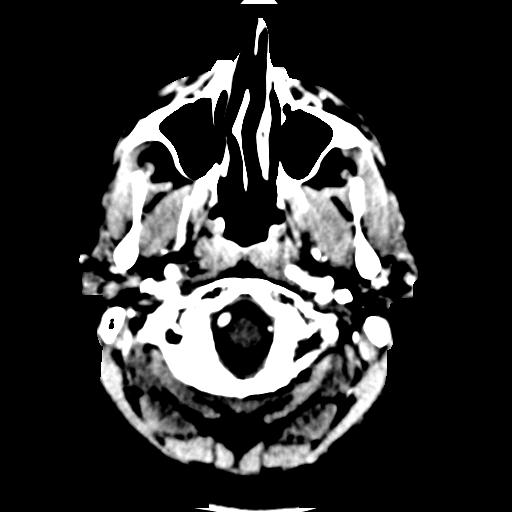
[im 9/69  bone]
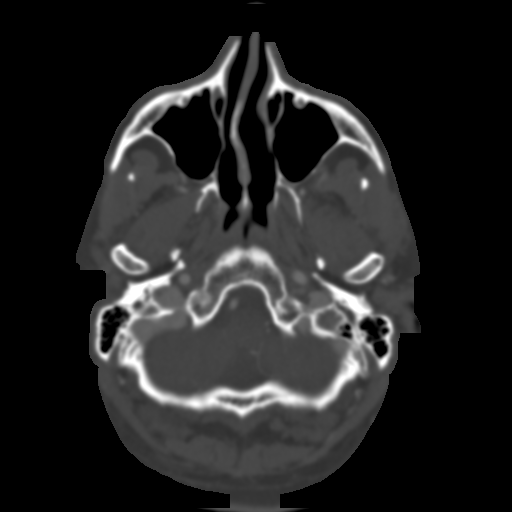
[im 13/69  brain]
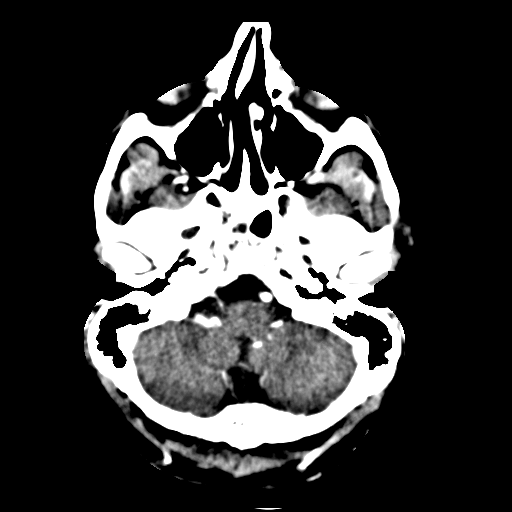
[im 18/69  bone]
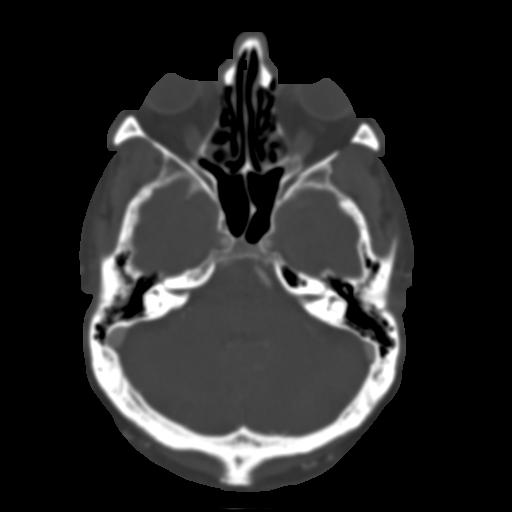
[im 22/69  brain]
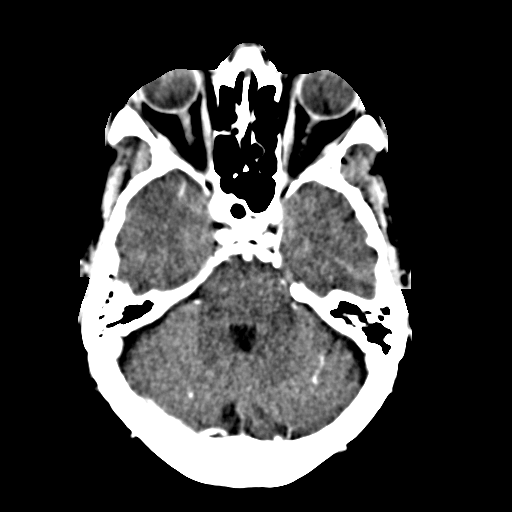
[im 26/69  bone]
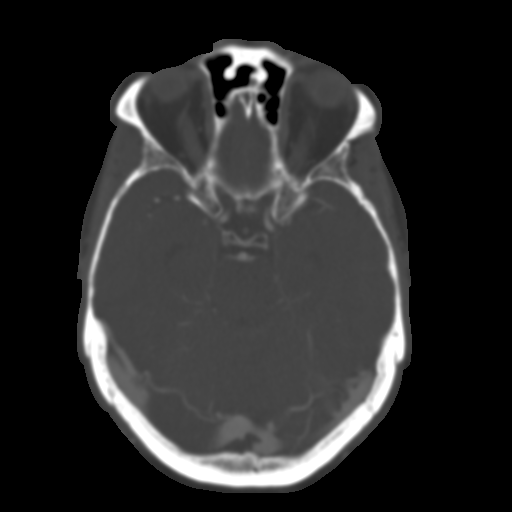
[im 30/69  brain]
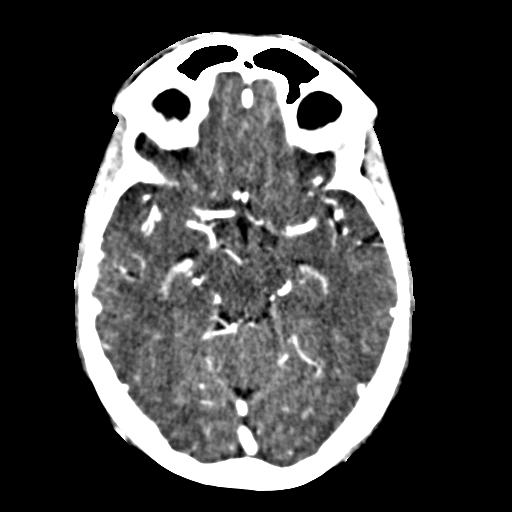
[im 35/69  bone]
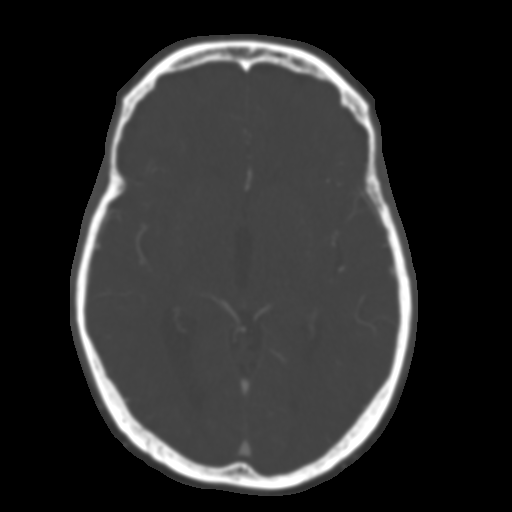
[im 39/69  brain]
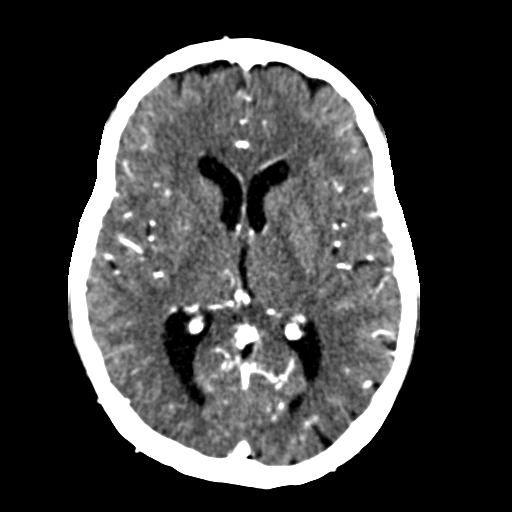
[im 43/69  bone]
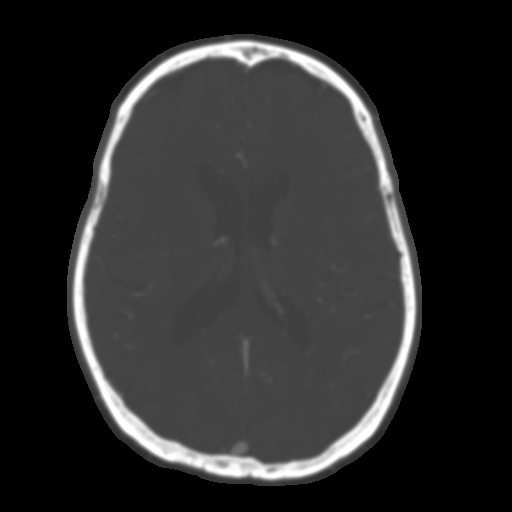
[im 47/69  brain]
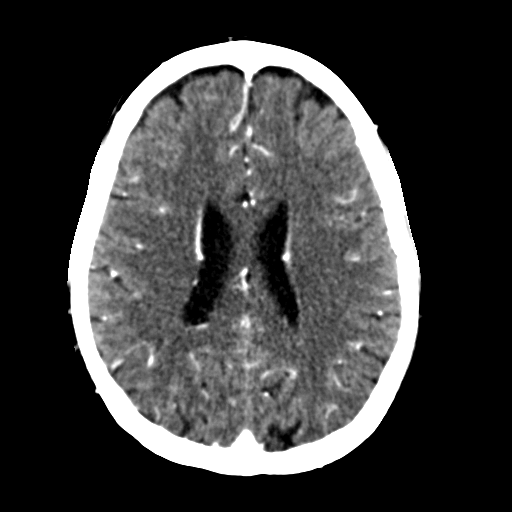
[im 52/69  bone]
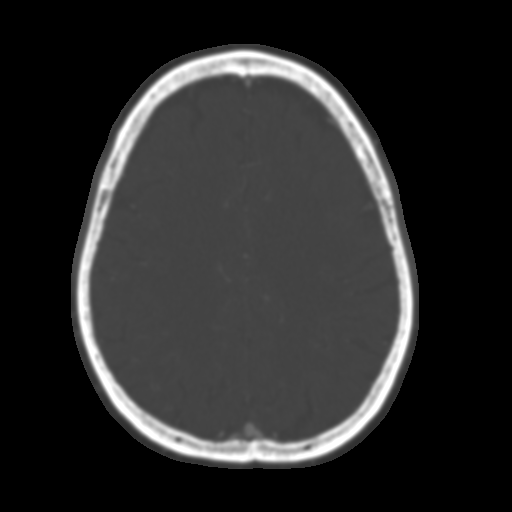
[im 56/69  brain]
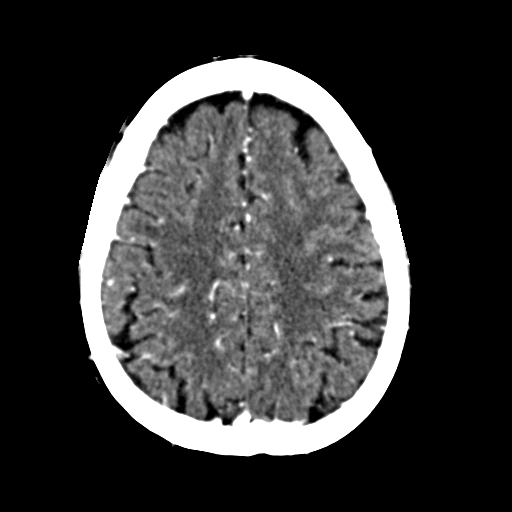
[im 60/69  bone]
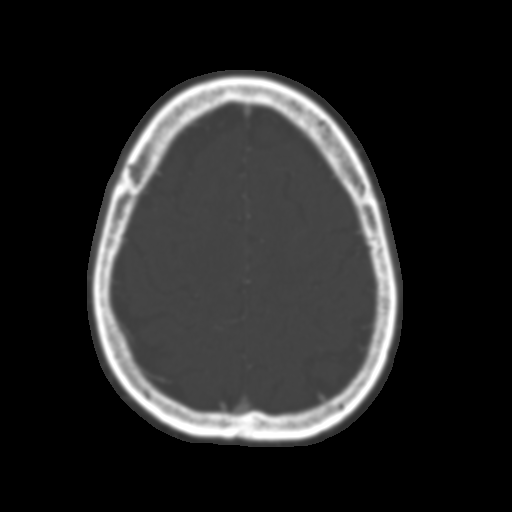
[im 64/69  brain]
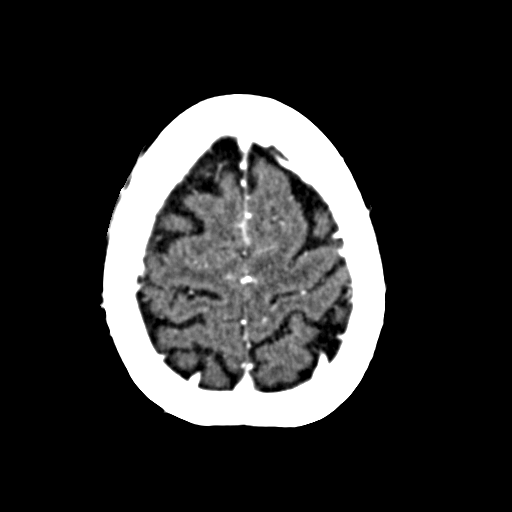

[Series 9: head bone · axial · 0.45mm/px · z∈[+1240,+1264]mm · 2 of 30 slices shown]
[im 5/30  bone]
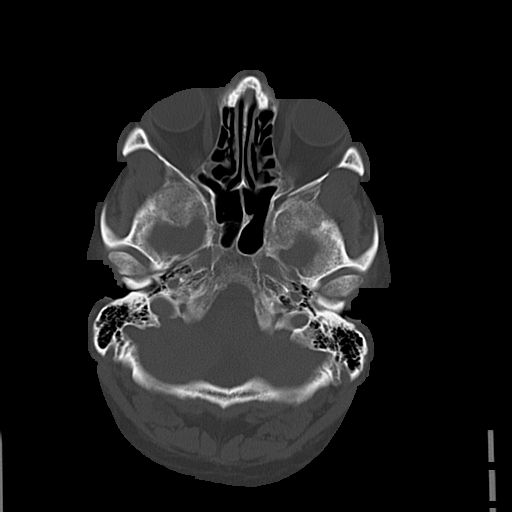
[im 10/30  bone]
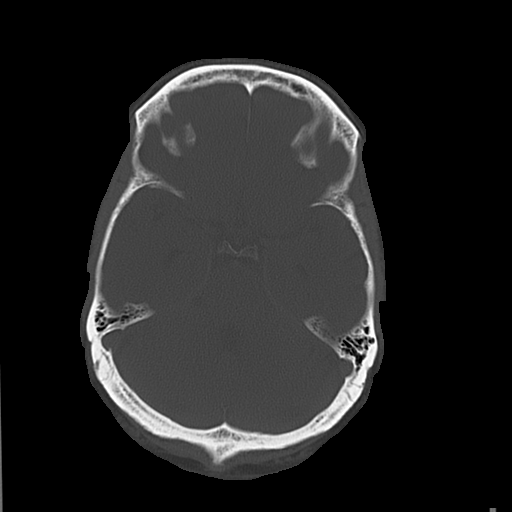

[17 of 47 positions shown; findings below may reference images not displayed]

FINDINGS: Minimal paranasal sinus mucosal thickening. No osseous abnormality
identified. Mastoids are clear. Visualized scalp soft tissues are
within normal limits.

Visualized orbits soft tissues appear normal. No intraorbital mass
identified. CT appearance of the cavernous sinus is within normal
limits.

Cerebral volume is within normal limits for age. No midline shift,
ventriculomegaly, mass effect, evidence of mass lesion, intracranial
hemorrhage or evidence of cortically based acute infarction.
Gray-white matter differentiation is within normal limits throughout
the brain. No abnormal enhancement identified. VASCULAR FINDINGS:

Major intracranial venous structures are enhancing.

Dominant distal right vertebral artery, the left is diminutive and
functionally terminates in PICA. Mildly tortuous vertebrobasilar
junction. Normal basilar artery. The basilar tip is mildly ectatic
(series 605, image 86) with shared origins of the SCAs and PCAs, but
there is no basilar tip aneurysm. Small right posterior
communicating artery, the left is diminutive or absent. Bilateral
PCA branches are within normal limits.

Tortuous distal cervical left ICA at the skullbase. No stenosis of
the ICA siphons. Cavernous ICA segments appear normal. No
abnormality at the ophthalmic artery origins. Normal left ICA
terminus.

There is a laterally and slightly inferiorly directed saccular
aneurysm arising from the supra clinoid right ICA, directed
laterally just proximal to the ICA terminus and measuring 2 x 3 x 4
mm (series 7, image 27 and series 605, images 72 and 73). This has a
neck approximately 2 mm.

More distally the right carotid terminus is normal. Bilateral MCA
and ACA origins are within normal limits.

The anterior communicating artery is diminutive, but there is
evidence of a tiny anteriorly directed 2 mm A-comm aneurysm on
series 605, image 62 and on series 604, image 83. Other bilateral
ACA branches are within normal limits.

Left MCA branches are within normal limits. Right MCA branches are
within normal limits.

Review of the MIP images confirms the above findings.
IMPRESSION: 1. Two small intracranial aneurysms are identified:
2 x 3 x 4 mm right ICA terminus aneurysm directed laterally,
2 mm anterior communicating artery aneurysm directed anteriorly.
2. No evidence of large or medium-sized vessel vasculitis by CTA. No
other intracranial aneurysm identified (there is mild ectasia of the
basilar tip). No cavernous ICA segment abnormality identified.
3. Otherwise negative CT appearance of the brain.

## 2015-11-02 ENCOUNTER — Other Ambulatory Visit: Payer: Self-pay | Admitting: Gastroenterology

## 2015-11-04 NOTE — Telephone Encounter (Signed)
Rx refill request for Dexilant 60 mg. Last seen on 09-06-2015

## 2015-11-04 NOTE — Telephone Encounter (Signed)
Dr [name] refilled for one month with one refill.

## 2015-11-07 DIAGNOSIS — M791 Myalgia: Secondary | ICD-10-CM | POA: Diagnosis not present

## 2015-11-07 DIAGNOSIS — Z79899 Other long term (current) drug therapy: Secondary | ICD-10-CM | POA: Diagnosis not present

## 2015-11-18 ENCOUNTER — Encounter: Payer: Self-pay | Admitting: *Deleted

## 2015-11-18 DIAGNOSIS — I671 Cerebral aneurysm, nonruptured: Secondary | ICD-10-CM

## 2015-11-18 DIAGNOSIS — L400 Psoriasis vulgaris: Secondary | ICD-10-CM | POA: Diagnosis not present

## 2015-12-06 ENCOUNTER — Other Ambulatory Visit: Payer: Self-pay

## 2015-12-06 DIAGNOSIS — G4700 Insomnia, unspecified: Secondary | ICD-10-CM

## 2015-12-06 MED ORDER — AMITRIPTYLINE HCL 25 MG PO TABS: 50.0000 mg | ORAL_TABLET | Freq: Every evening | ORAL | Status: DC | PRN

## 2015-12-12 DIAGNOSIS — Z79899 Other long term (current) drug therapy: Secondary | ICD-10-CM | POA: Diagnosis not present

## 2015-12-12 DIAGNOSIS — M791 Myalgia: Secondary | ICD-10-CM | POA: Diagnosis not present

## 2015-12-31 ENCOUNTER — Other Ambulatory Visit: Payer: Self-pay | Admitting: Gastroenterology

## 2015-12-31 NOTE — Telephone Encounter (Signed)
Needs office follow up for further refills

## 2015-12-31 NOTE — Telephone Encounter (Signed)
Pt requesting refills on Dexilant 60mg  1 qd.

## 2015-12-31 NOTE — Telephone Encounter (Signed)
[place]. He states he will call and schedule a follow up upon returning to [facility].

## 2016-01-09 DIAGNOSIS — L409 Psoriasis, unspecified: Secondary | ICD-10-CM | POA: Diagnosis not present

## 2016-01-09 DIAGNOSIS — Z79899 Other long term (current) drug therapy: Secondary | ICD-10-CM | POA: Diagnosis not present

## 2016-01-09 DIAGNOSIS — M25542 Pain in joints of left hand: Secondary | ICD-10-CM | POA: Diagnosis not present

## 2016-01-09 DIAGNOSIS — I776 Arteritis, unspecified: Secondary | ICD-10-CM | POA: Diagnosis not present

## 2016-01-09 DIAGNOSIS — E291 Testicular hypofunction: Secondary | ICD-10-CM | POA: Diagnosis not present

## 2016-01-09 DIAGNOSIS — M25541 Pain in joints of right hand: Secondary | ICD-10-CM | POA: Diagnosis not present

## 2016-01-09 DIAGNOSIS — K219 Gastro-esophageal reflux disease without esophagitis: Secondary | ICD-10-CM | POA: Diagnosis not present

## 2016-01-09 DIAGNOSIS — R768 Other specified abnormal immunological findings in serum: Secondary | ICD-10-CM | POA: Diagnosis not present

## 2016-01-10 DIAGNOSIS — Z79899 Other long term (current) drug therapy: Secondary | ICD-10-CM | POA: Diagnosis not present

## 2016-01-10 DIAGNOSIS — M791 Myalgia: Secondary | ICD-10-CM | POA: Diagnosis not present

## 2016-02-10 DIAGNOSIS — M791 Myalgia: Secondary | ICD-10-CM | POA: Diagnosis not present

## 2016-02-10 DIAGNOSIS — Z79899 Other long term (current) drug therapy: Secondary | ICD-10-CM | POA: Diagnosis not present

## 2016-02-12 ENCOUNTER — Other Ambulatory Visit: Payer: Self-pay | Admitting: Family Medicine

## 2016-02-12 DIAGNOSIS — E785 Hyperlipidemia, unspecified: Secondary | ICD-10-CM

## 2016-03-06 ENCOUNTER — Other Ambulatory Visit: Payer: Self-pay | Admitting: Gastroenterology

## 2016-03-10 ENCOUNTER — Other Ambulatory Visit: Payer: Self-pay | Admitting: Family Medicine

## 2016-03-10 DIAGNOSIS — E785 Hyperlipidemia, unspecified: Secondary | ICD-10-CM

## 2016-03-11 DIAGNOSIS — Z79899 Other long term (current) drug therapy: Secondary | ICD-10-CM | POA: Diagnosis not present

## 2016-03-11 DIAGNOSIS — M791 Myalgia: Secondary | ICD-10-CM | POA: Diagnosis not present

## 2016-03-18 ENCOUNTER — Ambulatory Visit: Payer: Medicare Other | Admitting: Family Medicine

## 2016-03-19 ENCOUNTER — Ambulatory Visit (INDEPENDENT_AMBULATORY_CARE_PROVIDER_SITE_OTHER): Payer: Medicare Other | Admitting: Family Medicine

## 2016-03-19 ENCOUNTER — Encounter: Payer: Self-pay | Admitting: Family Medicine

## 2016-03-19 VITALS — BP 138/72 | HR 97 | Temp 98.2°F | Resp 18 | Ht 69.25 in | Wt 178.2 lb

## 2016-03-19 DIAGNOSIS — R490 Dysphonia: Secondary | ICD-10-CM

## 2016-03-19 DIAGNOSIS — K219 Gastro-esophageal reflux disease without esophagitis: Secondary | ICD-10-CM | POA: Diagnosis not present

## 2016-03-19 DIAGNOSIS — E785 Hyperlipidemia, unspecified: Secondary | ICD-10-CM | POA: Diagnosis not present

## 2016-03-19 DIAGNOSIS — I10 Essential (primary) hypertension: Secondary | ICD-10-CM | POA: Diagnosis not present

## 2016-03-19 LAB — COMPLETE METABOLIC PANEL WITH GFR
ALT: 16 U/L (ref 9–46)
AST: 19 U/L (ref 10–35)
Albumin: 4 g/dL (ref 3.6–5.1)
Alkaline Phosphatase: 63 U/L (ref 40–115)
BUN: 10 mg/dL (ref 7–25)
CHLORIDE: 103 mmol/L (ref 98–110)
CO2: 29 mmol/L (ref 20–31)
CREATININE: 0.99 mg/dL (ref 0.70–1.25)
Calcium: 9.1 mg/dL (ref 8.6–10.3)
GFR, Est African American: >89 mL/min (ref >=60)
GFR, Est Non African American: 79 mL/min (ref >=60)
GLUCOSE: 118 mg/dL — AB (ref 65–99)
Potassium: 4.6 mmol/L (ref 3.5–5.3)
Sodium: 137 mmol/L (ref 135–146)
Total Bilirubin: 0.3 mg/dL (ref 0.2–1.2)
Total Protein: 6.3 g/dL (ref 6.1–8.1)

## 2016-03-19 LAB — LIPID PANEL
Cholesterol: 166 mg/dL (ref 125–200)
HDL: 32 mg/dL — ABNORMAL LOW (ref >=40)
LDL CALC: 114 mg/dL (ref <130)
Total CHOL/HDL Ratio: 5.2 Ratio — ABNORMAL HIGH (ref <=5.0)
Triglycerides: 99 mg/dL (ref <150)
VLDL: 20 mg/dL (ref <30)

## 2016-03-19 MED ORDER — LISINOPRIL 40 MG PO TABS: 40.0000 mg | ORAL_TABLET | Freq: Every day | ORAL | 1 refills | Status: DC

## 2016-03-19 MED ORDER — HYDROCHLOROTHIAZIDE 25 MG PO TABS: 25.0000 mg | ORAL_TABLET | Freq: Every day | ORAL | 1 refills | Status: DC

## 2016-03-19 MED ORDER — METOPROLOL SUCCINATE ER 25 MG PO TB24: 25.0000 mg | ORAL_TABLET | Freq: Every day | ORAL | 1 refills | Status: DC

## 2016-03-19 MED ORDER — ATORVASTATIN CALCIUM 20 MG PO TABS: 20.0000 mg | ORAL_TABLET | Freq: Every day | ORAL | 1 refills | Status: DC

## 2016-03-19 NOTE — Patient Instructions (Addendum)
  Checking lipid panel today. If elevated, may need to repeat fasting for 8 hours. Can try other statin to see if less heartburn symptoms, but this is not a typical side effect I've seen with statins.   Change metoprolol to the long acting or once daily medication. No other changes in blood pressure medicines for now. Keep an eye on your blood pressure outside of the office, and if remaining over 140/90, return to discuss further changes. Follow-up with me in 6 months for a physical.  If you need assistance scheduling with new gastroenterologist at [facility], let me know. I would get another opinion on various treatments for your reflux and hoarseness. Keep follow-up with rheumatologist.   IF you received an x-ray today, you will receive an invoice from [facility]. Please contact [facility] at [phone] with questions or concerns regarding your invoice.   IF you received labwork today, you will receive an invoice from [employer]. Please contact [facility] at [phone] with questions or concerns regarding your invoice.   Our billing staff will not be able to assist you with questions regarding bills from these companies.  You will be contacted with the lab results as soon as they are available. The fastest way to get your results is to activate your My Chart account. Instructions are located on the last page of this paperwork. If you have not heard from [place] regarding the results in 2 weeks, please contact this office.

## 2016-03-19 NOTE — Progress Notes (Signed)
Subjective:  By signing my name below, I, Johnny Bryant, attest that this documentation has been prepared under the direction and in the presence of Johnny Ray, MD. Electronically Signed: [name], [place]. 03/19/2016 , 8:48 AM .  Patient was seen in Room 1 .   Patient ID: Johnny Bryant, male    DOB: 05/26/1951, 65 y.o.   MRN: [id number] Chief Complaint  Patient presents with  . Follow-up    medication follow up   HPI Johnny Bryant is a 65 y.o. male Here for follow up. H/o multiple medical problems including HTN, GERD, HLD, and vasculitis. He's been staying stable.   HTN H/o left nephrectomy in Aug 2015 for renal cancer. He was evaluated by Dr. Percival Spanish in the past due to palpitations with rare atrial tachycardia on Holter; he was stable last visit, continued on HCTZ, lisinopril, and metoprolol same doses.   He notes his BP has been running around 140s/85~ at home. He also informs mostly taking the metoprolol in the morning, and usually forgets the evening pill. He denies any side effects with it. He denies shortness of breath, cough, chest pain, or Bryant in stool.   GERD Normal endoscopy in Sept 2016. His previous GI was Dr. [name], referred to new GI this year. He was on nexium last visit continued BID as well as H2 blocker as advised by ENT for hoarseness, thought due to reflux.   Patient informs that his new GI wasn't a good fit because he was suggested to just have surgery done if nothing else works. He's stopped taking nexium, and takes dexilant once a day.   Vasculitis Followed by rheumatology at [facility], Dr. [name].   HLD No results found for: CHOL, HDL, LDLCALC, LDLDIRECT, TRIG, CHOLHDL   Lab Results  Component Value Date   ALT 27 10/09/2013   AST 22 10/09/2013   ALKPHOS 73 10/09/2013   BILITOT 0.4 10/09/2013   He's on pravachol 20mg . He reports that his sister was also on pravachol in the past with similar GERD symptoms, but was switched to crestor. He was also  suggested zocor if insurance isn't covered. He notes having bloodwork done at [facility], but last cholesterol done at [facility] was in May 2016.   Immunizations He wants to defer his flu shot until he talks to his rheumatologist about it first.   Patient Active Problem List   Diagnosis Date Noted  . Aneurysm, cerebral, nonruptured 11/18/2015  . 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 Esophageal reflux 02/20/2015  . Hoarseness 02/20/2015  . Abdominal pain, epigastric 02/20/2015  . Difficulty hearing 09/28/2014  . Abnormal weight loss 09/28/2014  . Essential (primary) hypertension 08/02/2014  . Gastro-esophageal reflux disease without esophagitis 08/02/2014  . Hereditary and idiopathic peripheral neuropathy 04/25/2014  . Vasculitis ([place]) 12/25/2013  . Left renal mass 11/09/2013  . Palpitation 10/31/2013  . Sudden hearing loss, unspecified 09/28/2013  . Sensorineural hearing loss, unspecified 09/28/2013  . Deafness, sensorineural 09/28/2013  . Chronic pain syndrome 08/18/2013  . Hematuria 08/18/2013  . Heme positive stool 08/18/2013   Past Medical History:  Diagnosis Date  . Arthritis   . Chronic back pain   . GERD (gastroesophageal reflux disease)   . History of kidney cancer    left  . Hypercholesteremia   . Hypertension   . Vasculitis [facility])    Past Surgical History:  Procedure Laterality Date  . COLONOSCOPY    . COSMETIC SURGERY    . ELBOW SURGERY Right   . FRACTURE SURGERY Left  wrist  . kidney cancer surgery Left 02/2014   Dr. Frances Furbish at [facility]  . SHOULDER SURGERY Left    x 3  . VASECTOMY     Allergies  Allergen Reactions  . Cymbalta [Duloxetine Hcl] Other (See Comments) and Anxiety    Cranky and moods changes   . Peanut Oil Rash   Prior to Admission medications   Medication Sig Start Date End Date Taking? Authorizing Provider  amitriptyline (ELAVIL) 25 MG tablet Take 2 tablets (50 mg total) by mouth at bedtime as needed for sleep. 12/06/15   [name], MD  b complex  vitamins tablet Take 1 tablet by mouth daily.    Historical Provider, MD  Calcium Carbonate-Vitamin D (CALCIUM 500 + D) 500-125 MG-UNIT TABS Take 1,000 mg by mouth.     Historical Provider, MD  DEXILANT 60 MG capsule TAKE 1 CAPSULE (60 MG TOTAL) BY MOUTH DAILY. TAKE AT [name] MEAL 03/06/16   [name], MD  esomeprazole (NEXIUM) 40 MG capsule Take 1 capsule (40 mg total) by mouth daily. 09/04/15   [name], MD  gabapentin (NEURONTIN) 600 MG tablet Take 600 mg by mouth 4 (four) times daily.    Historical Provider, MD  hydrochlorothiazide (HYDRODIURIL) 25 MG tablet Take 1 tablet (25 mg total) by mouth daily. 09/04/15   [name], MD  lisinopril (PRINIVIL,ZESTRIL) 40 MG tablet TAKE 1 TABLET (40 MG TOTAL) BY MOUTH DAILY. 03/11/16   [name], MD  MAGNESIUM PO Take 1 tablet by mouth daily. 500mg  tablet size    Historical Provider, MD  methotrexate (50 MG/ML) 1 G injection Inject into the vein once. Takes 8 cc weekly    Historical Provider, MD  metoprolol tartrate (LOPRESSOR) 25 MG tablet Take 0.5 tablets (12.5 mg total) by mouth 2 (two) times daily. 09/04/15   [name], MD  Misc Natural Products (TURMERIC CURCUMIN) CAPS Take 1 capsule by mouth daily.    Historical Provider, MD  Multiple Vitamin (MULTIVITAMIN WITH MINERALS) TABS tablet Take 1 tablet by mouth daily.    Historical Provider, MD  Oxycodone HCl 10 MG TABS Take 10 mg by mouth 3 (three) times daily.  03/15/14   Historical Provider, MD  Potassium 99 MG TABS Take by mouth daily.     Historical Provider, MD  pravastatin (PRAVACHOL) 20 MG tablet TAKE ONE TABLET BY MOUTH ONE TIME DAILY 02/13/16   [name], MD  triamcinolone cream (KENALOG) 0.1 % Apply 1 application topically 2 (two) times daily. 09/04/15   [name], MD  zinc gluconate 50 MG tablet Take 50 mg by mouth daily.    Historical Provider, MD   Social History   Social History  . Marital status: Significant Other    Spouse name: N/A  . Number  of children: 2  . Years of education: N/A   Occupational History  . sales    Social History Main Topics  . Smoking status: Former Smoker    Types: Cigarettes    Quit date: 11/19/1979  . Smokeless tobacco: Never Used  . Alcohol use No  . Drug use: No  . Sexual activity: Not on file   Other Topics Concern  . Not on file   Social History Narrative  . No narrative on file   Review of Systems  Constitutional: Negative for fatigue and unexpected weight change.  Eyes: Negative for visual disturbance.  Respiratory: Negative for cough, chest tightness and shortness of breath.  Cardiovascular: Negative for chest pain, palpitations and leg swelling.  Gastrointestinal: Negative for abdominal pain and Bryant in stool.  Neurological: Negative for dizziness, light-headedness and headaches.       Objective:   Physical Exam  Constitutional: He is oriented to person, place, and time. He appears well-developed and well-nourished.  HENT:  Head: Normocephalic and atraumatic.  Eyes: EOM are normal. Pupils are equal, round, and reactive to light.  Neck: No JVD present. Carotid bruit is not present.  Cardiovascular: Normal rate, regular rhythm and normal heart sounds.   No murmur heard. Pulmonary/Chest: Effort normal and breath sounds normal. He has no rales.  Musculoskeletal: He exhibits no edema.  Neurological: He is alert and oriented to person, place, and time.  Skin: Skin is warm and dry.  Psychiatric: He has a normal mood and affect.  Vitals reviewed.   Vitals:   03/19/16 0823  BP: 138/72  Pulse: 97  Resp: 18  Temp: 98.2 F (36.8 C)  TempSrc: Oral  SpO2: 99%  Weight: 178 lb 3.2 oz (80.8 kg)  Height: 5' 9.25" (1.759 m)      Assessment & Plan:    Johnny Bryant is a 65 y.o. male Hyperlipidemia - Plan: COMPLETE METABOLIC PANEL WITH GFR, Lipid panel, atorvastatin (LIPITOR) 20 MG tablet  - can try lipitor, but less likely statin causing heartburn. If labs elevated, may need 8  hr fasting level.   Essential hypertension - Plan: metoprolol succinate (TOPROL-XL) 25 MG 24 hr tablet, COMPLETE METABOLIC PANEL WITH GFR, lisinopril (PRINIVIL,ZESTRIL) 40 MG tablet, hydrochlorothiazide (HYDRODIURIL) 25 MG tablet  -borderline on home readings. Ok in office. Monitor readings, bring meter in next visit to correlate with ours. Change to Qd dosing of metoprolol to help with compliance.   Gastroesophageal reflux disease, esophagitis presence not specified Hoarseness of voice  -continue PPI and follow up with specialists. He plans on establishing with GI at [facility] to get another opinion on surgical option for GERD.   Meds ordered this encounter  Medications  . metoprolol succinate (TOPROL-XL) 25 MG 24 hr tablet    Sig: Take 1 tablet (25 mg total) by mouth daily.    Dispense:  90 tablet    Refill:  1  . atorvastatin (LIPITOR) 20 MG tablet    Sig: Take 1 tablet (20 mg total) by mouth daily.    Dispense:  90 tablet    Refill:  1  . lisinopril (PRINIVIL,ZESTRIL) 40 MG tablet    Sig: Take 1 tablet (40 mg total) by mouth daily.    Dispense:  90 tablet    Refill:  1  . hydrochlorothiazide (HYDRODIURIL) 25 MG tablet    Sig: Take 1 tablet (25 mg total) by mouth daily.    Dispense:  90 tablet    Refill:  1   Patient Instructions    Checking lipid panel today. If elevated, may need to repeat fasting for 8 hours. Can try other statin to see if less heartburn symptoms, but this is not a typical side effect I've seen with statins.   Change metoprolol to the long acting or once daily medication. No other changes in Bryant pressure medicines for now. Keep an eye on your Bryant pressure outside of the office, and if remaining over 140/90, return to discuss further changes. Follow-up with me in 6 months for a physical.  If you need assistance scheduling with new gastroenterologist at [facility], let me know. I would get another opinion on various treatments for your reflux and  hoarseness. Keep  follow-up with rheumatologist.   IF you received an x-Bryant today, you will receive an invoice from [facility]. Please contact [facility] at [phone] with questions or concerns regarding your invoice.   IF you received labwork today, you will receive an invoice from [employer]. Please contact [facility] at [phone] with questions or concerns regarding your invoice.   Our billing staff will not be able to assist you with questions regarding bills from these companies.  You will be contacted with the lab results as soon as they are available. The fastest way to get your results is to activate your My Chart account. Instructions are located on the last page of this paperwork. If you have not heard from [place] regarding the results in 2 weeks, please contact this office.        I personally performed the services described in this documentation, which was scribed in my presence. The recorded information has been reviewed and considered, and addended by me as needed.   Signed,   Johnny Ray, MD [facility].  03/20/16 8:53 AM

## 2016-04-02 ENCOUNTER — Other Ambulatory Visit: Payer: Self-pay | Admitting: Family Medicine

## 2016-04-02 DIAGNOSIS — G4700 Insomnia, unspecified: Secondary | ICD-10-CM

## 2016-04-03 NOTE — Telephone Encounter (Signed)
Please advise on refills. No notes on most recent visit.

## 2016-04-03 NOTE — Telephone Encounter (Signed)
Okay to refill. Can discuss further next office visit.

## 2016-04-06 DIAGNOSIS — Z79899 Other long term (current) drug therapy: Secondary | ICD-10-CM | POA: Diagnosis not present

## 2016-04-06 DIAGNOSIS — M791 Myalgia: Secondary | ICD-10-CM | POA: Diagnosis not present

## 2016-04-09 ENCOUNTER — Other Ambulatory Visit: Payer: Self-pay | Admitting: Family Medicine

## 2016-04-09 DIAGNOSIS — E785 Hyperlipidemia, unspecified: Secondary | ICD-10-CM

## 2016-05-01 ENCOUNTER — Other Ambulatory Visit: Payer: Self-pay | Admitting: Gastroenterology

## 2016-05-01 NOTE — Telephone Encounter (Signed)
Rx refill request for Dexilant 60 mg 1 a day. Last seen March 2017

## 2016-05-06 DIAGNOSIS — Z79899 Other long term (current) drug therapy: Secondary | ICD-10-CM | POA: Diagnosis not present

## 2016-05-06 DIAGNOSIS — M791 Myalgia: Secondary | ICD-10-CM | POA: Diagnosis not present

## 2016-05-11 DIAGNOSIS — Z08 Encounter for follow-up examination after completed treatment for malignant neoplasm: Secondary | ICD-10-CM | POA: Diagnosis not present

## 2016-05-11 DIAGNOSIS — N529 Male erectile dysfunction, unspecified: Secondary | ICD-10-CM | POA: Diagnosis not present

## 2016-05-11 DIAGNOSIS — Z7952 Long term (current) use of systemic steroids: Secondary | ICD-10-CM | POA: Diagnosis not present

## 2016-05-11 DIAGNOSIS — Z79899 Other long term (current) drug therapy: Secondary | ICD-10-CM | POA: Diagnosis not present

## 2016-05-11 DIAGNOSIS — E291 Testicular hypofunction: Secondary | ICD-10-CM | POA: Diagnosis not present

## 2016-05-11 DIAGNOSIS — C642 Malignant neoplasm of left kidney, except renal pelvis: Secondary | ICD-10-CM | POA: Diagnosis not present

## 2016-05-11 DIAGNOSIS — Z79891 Long term (current) use of opiate analgesic: Secondary | ICD-10-CM | POA: Diagnosis not present

## 2016-05-11 DIAGNOSIS — I776 Arteritis, unspecified: Secondary | ICD-10-CM | POA: Diagnosis not present

## 2016-05-11 DIAGNOSIS — N401 Enlarged prostate with lower urinary tract symptoms: Secondary | ICD-10-CM | POA: Diagnosis not present

## 2016-05-11 DIAGNOSIS — Z7982 Long term (current) use of aspirin: Secondary | ICD-10-CM | POA: Diagnosis not present

## 2016-05-11 DIAGNOSIS — Z905 Acquired absence of kidney: Secondary | ICD-10-CM | POA: Diagnosis not present

## 2016-05-11 DIAGNOSIS — Z85528 Personal history of other malignant neoplasm of kidney: Secondary | ICD-10-CM | POA: Diagnosis not present

## 2016-05-11 DIAGNOSIS — R399 Unspecified symptoms and signs involving the genitourinary system: Secondary | ICD-10-CM | POA: Diagnosis not present

## 2016-05-11 DIAGNOSIS — Z794 Long term (current) use of insulin: Secondary | ICD-10-CM | POA: Diagnosis not present

## 2016-06-02 ENCOUNTER — Telehealth: Payer: Self-pay

## 2016-06-02 MED ORDER — DEXLANSOPRAZOLE 60 MG PO CPDR: DELAYED_RELEASE_CAPSULE | ORAL | 3 refills | Status: DC

## 2016-06-02 NOTE — Telephone Encounter (Signed)
Yes, it can be refilled for one month with 3 refills.  Needs to see me in office Feb/Mar if wants me to refill it. If it would be easier for him and if PCP agreeable, PCP can fill it as well. I would also like him to consider the pH and impedance testing I recommended when I saw him earlier this year. It might reveal that he is a candidate for anti-GERD surgery, and then he might not need this sort of medicine any more.  Just think about it

## 2016-06-02 NOTE — Telephone Encounter (Signed)
Incoming fax request fron [facility]. Refill request for Dexilant 60 mg 1 a day. Last seen 09-2015. No current follow up scheduled please advise on refills.

## 2016-06-02 NOTE — Telephone Encounter (Signed)
Left a detailed message on pts voicemail. rx refilled as directed.

## 2016-06-03 DIAGNOSIS — M791 Myalgia: Secondary | ICD-10-CM | POA: Diagnosis not present

## 2016-06-03 DIAGNOSIS — Z79899 Other long term (current) drug therapy: Secondary | ICD-10-CM | POA: Diagnosis not present

## 2016-06-23 DIAGNOSIS — M791 Myalgia: Secondary | ICD-10-CM | POA: Diagnosis not present

## 2016-06-23 DIAGNOSIS — Z79899 Other long term (current) drug therapy: Secondary | ICD-10-CM | POA: Diagnosis not present

## 2016-06-30 ENCOUNTER — Other Ambulatory Visit: Payer: Self-pay | Admitting: Family Medicine

## 2016-06-30 DIAGNOSIS — G4700 Insomnia, unspecified: Secondary | ICD-10-CM

## 2016-07-30 DIAGNOSIS — Z79899 Other long term (current) drug therapy: Secondary | ICD-10-CM | POA: Diagnosis not present

## 2016-07-30 DIAGNOSIS — M791 Myalgia: Secondary | ICD-10-CM | POA: Diagnosis not present

## 2016-08-26 DIAGNOSIS — Z79899 Other long term (current) drug therapy: Secondary | ICD-10-CM | POA: Diagnosis not present

## 2016-08-26 DIAGNOSIS — M791 Myalgia: Secondary | ICD-10-CM | POA: Diagnosis not present

## 2016-08-27 DIAGNOSIS — Z7952 Long term (current) use of systemic steroids: Secondary | ICD-10-CM | POA: Diagnosis not present

## 2016-08-27 DIAGNOSIS — R0600 Dyspnea, unspecified: Secondary | ICD-10-CM | POA: Diagnosis not present

## 2016-08-27 DIAGNOSIS — R490 Dysphonia: Secondary | ICD-10-CM | POA: Diagnosis not present

## 2016-08-27 DIAGNOSIS — I776 Arteritis, unspecified: Secondary | ICD-10-CM | POA: Diagnosis not present

## 2016-08-27 DIAGNOSIS — M542 Cervicalgia: Secondary | ICD-10-CM | POA: Diagnosis not present

## 2016-08-27 DIAGNOSIS — M25512 Pain in left shoulder: Secondary | ICD-10-CM | POA: Diagnosis not present

## 2016-08-27 DIAGNOSIS — M1990 Unspecified osteoarthritis, unspecified site: Secondary | ICD-10-CM | POA: Diagnosis not present

## 2016-08-27 DIAGNOSIS — Z8249 Family history of ischemic heart disease and other diseases of the circulatory system: Secondary | ICD-10-CM | POA: Diagnosis not present

## 2016-08-27 DIAGNOSIS — K219 Gastro-esophageal reflux disease without esophagitis: Secondary | ICD-10-CM | POA: Diagnosis not present

## 2016-08-27 DIAGNOSIS — G8929 Other chronic pain: Secondary | ICD-10-CM | POA: Diagnosis not present

## 2016-08-27 DIAGNOSIS — L409 Psoriasis, unspecified: Secondary | ICD-10-CM | POA: Diagnosis not present

## 2016-08-27 DIAGNOSIS — Z91010 Allergy to peanuts: Secondary | ICD-10-CM | POA: Diagnosis not present

## 2016-08-27 DIAGNOSIS — Z888 Allergy status to other drugs, medicaments and biological substances status: Secondary | ICD-10-CM | POA: Diagnosis not present

## 2016-08-27 DIAGNOSIS — G5621 Lesion of ulnar nerve, right upper limb: Secondary | ICD-10-CM | POA: Diagnosis not present

## 2016-08-27 DIAGNOSIS — Z79899 Other long term (current) drug therapy: Secondary | ICD-10-CM | POA: Diagnosis not present

## 2016-08-27 DIAGNOSIS — I10 Essential (primary) hypertension: Secondary | ICD-10-CM | POA: Diagnosis not present

## 2016-08-27 DIAGNOSIS — R208 Other disturbances of skin sensation: Secondary | ICD-10-CM | POA: Diagnosis not present

## 2016-08-27 DIAGNOSIS — Z87891 Personal history of nicotine dependence: Secondary | ICD-10-CM | POA: Diagnosis not present

## 2016-08-27 DIAGNOSIS — Z905 Acquired absence of kidney: Secondary | ICD-10-CM | POA: Diagnosis not present

## 2016-08-27 DIAGNOSIS — Z7982 Long term (current) use of aspirin: Secondary | ICD-10-CM | POA: Diagnosis not present

## 2016-08-27 DIAGNOSIS — E785 Hyperlipidemia, unspecified: Secondary | ICD-10-CM | POA: Diagnosis not present

## 2016-09-17 ENCOUNTER — Other Ambulatory Visit: Payer: Self-pay | Admitting: Family Medicine

## 2016-09-17 DIAGNOSIS — E785 Hyperlipidemia, unspecified: Secondary | ICD-10-CM

## 2016-09-17 DIAGNOSIS — I10 Essential (primary) hypertension: Secondary | ICD-10-CM

## 2016-09-17 NOTE — Telephone Encounter (Signed)
Spoke with patient. Advised of refills and need to schedule follow-up. Transferred call to Ms. A Payne to schedule.  Meds ordered this encounter  Medications  . hydrochlorothiazide (HYDRODIURIL) 25 MG tablet    Sig: TAKE 1 TABLET (25 MG TOTAL) BY MOUTH DAILY.    Dispense:  90 tablet    Refill:  1  . atorvastatin (LIPITOR) 20 MG tablet    Sig: TAKE 1 TABLET (20 MG TOTAL) BY MOUTH DAILY.    Dispense:  90 tablet    Refill:  1  . metoprolol succinate (TOPROL-XL) 25 MG 24 hr tablet    Sig: TAKE 1 TABLET (25 MG TOTAL) BY MOUTH DAILY.    Dispense:  90 tablet    Refill:  1

## 2016-09-21 DIAGNOSIS — M25521 Pain in right elbow: Secondary | ICD-10-CM | POA: Diagnosis not present

## 2016-09-21 DIAGNOSIS — M25529 Pain in unspecified elbow: Secondary | ICD-10-CM | POA: Diagnosis not present

## 2016-09-22 DIAGNOSIS — Z79899 Other long term (current) drug therapy: Secondary | ICD-10-CM | POA: Diagnosis not present

## 2016-09-22 DIAGNOSIS — M791 Myalgia: Secondary | ICD-10-CM | POA: Diagnosis not present

## 2016-09-23 DIAGNOSIS — G5621 Lesion of ulnar nerve, right upper limb: Secondary | ICD-10-CM | POA: Diagnosis not present

## 2016-09-23 DIAGNOSIS — M25529 Pain in unspecified elbow: Secondary | ICD-10-CM | POA: Diagnosis not present

## 2016-09-30 ENCOUNTER — Other Ambulatory Visit: Payer: Self-pay | Admitting: Family Medicine

## 2016-09-30 DIAGNOSIS — G4700 Insomnia, unspecified: Secondary | ICD-10-CM

## 2016-10-01 ENCOUNTER — Ambulatory Visit: Payer: Medicare Other | Admitting: Family Medicine

## 2016-10-02 DIAGNOSIS — Z888 Allergy status to other drugs, medicaments and biological substances status: Secondary | ICD-10-CM | POA: Diagnosis not present

## 2016-10-02 DIAGNOSIS — Z91010 Allergy to peanuts: Secondary | ICD-10-CM | POA: Diagnosis not present

## 2016-10-02 DIAGNOSIS — Z79899 Other long term (current) drug therapy: Secondary | ICD-10-CM | POA: Diagnosis not present

## 2016-10-02 DIAGNOSIS — Z7982 Long term (current) use of aspirin: Secondary | ICD-10-CM | POA: Diagnosis not present

## 2016-10-02 DIAGNOSIS — Z87891 Personal history of nicotine dependence: Secondary | ICD-10-CM | POA: Diagnosis not present

## 2016-10-02 DIAGNOSIS — G8929 Other chronic pain: Secondary | ICD-10-CM | POA: Diagnosis not present

## 2016-10-02 DIAGNOSIS — M1990 Unspecified osteoarthritis, unspecified site: Secondary | ICD-10-CM | POA: Diagnosis not present

## 2016-10-02 DIAGNOSIS — I10 Essential (primary) hypertension: Secondary | ICD-10-CM | POA: Diagnosis not present

## 2016-10-02 DIAGNOSIS — Z7952 Long term (current) use of systemic steroids: Secondary | ICD-10-CM | POA: Diagnosis not present

## 2016-10-02 DIAGNOSIS — M25512 Pain in left shoulder: Secondary | ICD-10-CM | POA: Diagnosis not present

## 2016-10-14 ENCOUNTER — Other Ambulatory Visit: Payer: Self-pay | Admitting: Family Medicine

## 2016-10-14 DIAGNOSIS — I10 Essential (primary) hypertension: Secondary | ICD-10-CM

## 2016-10-17 ENCOUNTER — Other Ambulatory Visit: Payer: Self-pay | Admitting: Family Medicine

## 2016-10-17 DIAGNOSIS — G4700 Insomnia, unspecified: Secondary | ICD-10-CM

## 2016-10-19 DIAGNOSIS — G5621 Lesion of ulnar nerve, right upper limb: Secondary | ICD-10-CM | POA: Diagnosis not present

## 2016-10-26 DIAGNOSIS — M791 Myalgia: Secondary | ICD-10-CM | POA: Diagnosis not present

## 2016-10-26 DIAGNOSIS — Z79899 Other long term (current) drug therapy: Secondary | ICD-10-CM | POA: Diagnosis not present

## 2016-11-01 ENCOUNTER — Other Ambulatory Visit: Payer: Self-pay | Admitting: Gastroenterology

## 2016-11-02 NOTE — Telephone Encounter (Signed)
OK for one month supply with one refill.  See me within 2 months if desires refills after that.

## 2016-11-02 NOTE — Telephone Encounter (Signed)
Refill request for dexilant. Last seen 09-2015. No current follow up. Please advise.

## 2016-11-03 NOTE — Telephone Encounter (Signed)
Left a detailed message of Dr [name]' recommendations. Refilled as directed.

## 2016-11-11 ENCOUNTER — Other Ambulatory Visit: Payer: Self-pay | Admitting: Family Medicine

## 2016-11-11 DIAGNOSIS — Z85528 Personal history of other malignant neoplasm of kidney: Secondary | ICD-10-CM | POA: Diagnosis not present

## 2016-11-11 DIAGNOSIS — Z7982 Long term (current) use of aspirin: Secondary | ICD-10-CM | POA: Diagnosis not present

## 2016-11-11 DIAGNOSIS — Z905 Acquired absence of kidney: Secondary | ICD-10-CM | POA: Diagnosis not present

## 2016-11-11 DIAGNOSIS — C649 Malignant neoplasm of unspecified kidney, except renal pelvis: Secondary | ICD-10-CM | POA: Diagnosis not present

## 2016-11-11 DIAGNOSIS — E291 Testicular hypofunction: Secondary | ICD-10-CM | POA: Diagnosis not present

## 2016-11-11 DIAGNOSIS — N529 Male erectile dysfunction, unspecified: Secondary | ICD-10-CM | POA: Diagnosis not present

## 2016-11-11 DIAGNOSIS — I10 Essential (primary) hypertension: Secondary | ICD-10-CM

## 2016-11-16 NOTE — Telephone Encounter (Signed)
Refilled lisinopril, but please schedule OV.  He cancelled appt in March.Marland Kitchen

## 2016-11-17 NOTE — Telephone Encounter (Signed)
Pt advised BP medication sent to pharmacy Transferred to schedulers to set up office visit with Dr. Carlota Raspberry

## 2016-11-25 DIAGNOSIS — Z79899 Other long term (current) drug therapy: Secondary | ICD-10-CM | POA: Diagnosis not present

## 2016-11-25 DIAGNOSIS — M791 Myalgia: Secondary | ICD-10-CM | POA: Diagnosis not present

## 2016-12-08 DIAGNOSIS — G5621 Lesion of ulnar nerve, right upper limb: Secondary | ICD-10-CM | POA: Diagnosis not present

## 2016-12-08 DIAGNOSIS — Z87891 Personal history of nicotine dependence: Secondary | ICD-10-CM | POA: Diagnosis not present

## 2016-12-08 DIAGNOSIS — K219 Gastro-esophageal reflux disease without esophagitis: Secondary | ICD-10-CM | POA: Diagnosis not present

## 2016-12-08 DIAGNOSIS — E785 Hyperlipidemia, unspecified: Secondary | ICD-10-CM | POA: Diagnosis not present

## 2016-12-08 DIAGNOSIS — Z9889 Other specified postprocedural states: Secondary | ICD-10-CM | POA: Diagnosis not present

## 2016-12-08 DIAGNOSIS — Z79899 Other long term (current) drug therapy: Secondary | ICD-10-CM | POA: Diagnosis not present

## 2016-12-08 DIAGNOSIS — I10 Essential (primary) hypertension: Secondary | ICD-10-CM | POA: Diagnosis not present

## 2016-12-21 DIAGNOSIS — R609 Edema, unspecified: Secondary | ICD-10-CM | POA: Diagnosis not present

## 2016-12-21 DIAGNOSIS — M25629 Stiffness of unspecified elbow, not elsewhere classified: Secondary | ICD-10-CM | POA: Diagnosis not present

## 2016-12-21 DIAGNOSIS — Z4789 Encounter for other orthopedic aftercare: Secondary | ICD-10-CM | POA: Diagnosis not present

## 2016-12-24 DIAGNOSIS — M791 Myalgia: Secondary | ICD-10-CM | POA: Diagnosis not present

## 2016-12-24 DIAGNOSIS — Z79899 Other long term (current) drug therapy: Secondary | ICD-10-CM | POA: Diagnosis not present

## 2016-12-28 ENCOUNTER — Encounter: Payer: Self-pay | Admitting: *Deleted

## 2016-12-31 DIAGNOSIS — Z4789 Encounter for other orthopedic aftercare: Secondary | ICD-10-CM | POA: Diagnosis not present

## 2016-12-31 DIAGNOSIS — R609 Edema, unspecified: Secondary | ICD-10-CM | POA: Diagnosis not present

## 2016-12-31 DIAGNOSIS — M25629 Stiffness of unspecified elbow, not elsewhere classified: Secondary | ICD-10-CM | POA: Diagnosis not present

## 2017-01-02 ENCOUNTER — Other Ambulatory Visit: Payer: Self-pay | Admitting: Gastroenterology

## 2017-01-21 DIAGNOSIS — M791 Myalgia: Secondary | ICD-10-CM | POA: Diagnosis not present

## 2017-01-21 DIAGNOSIS — Z79899 Other long term (current) drug therapy: Secondary | ICD-10-CM | POA: Diagnosis not present

## 2017-01-21 DIAGNOSIS — E7800 Pure hypercholesterolemia, unspecified: Secondary | ICD-10-CM | POA: Diagnosis not present

## 2017-02-19 DIAGNOSIS — Z79899 Other long term (current) drug therapy: Secondary | ICD-10-CM | POA: Diagnosis not present

## 2017-02-19 DIAGNOSIS — M791 Myalgia: Secondary | ICD-10-CM | POA: Diagnosis not present

## 2017-03-05 ENCOUNTER — Other Ambulatory Visit: Payer: Self-pay | Admitting: Family Medicine

## 2017-03-05 DIAGNOSIS — I10 Essential (primary) hypertension: Secondary | ICD-10-CM

## 2017-03-09 NOTE — Telephone Encounter (Signed)
mychart message sent to pt about  Making an apt for more refills

## 2017-03-15 ENCOUNTER — Other Ambulatory Visit: Payer: Self-pay | Admitting: Physician Assistant

## 2017-03-15 DIAGNOSIS — G4700 Insomnia, unspecified: Secondary | ICD-10-CM

## 2017-03-18 DIAGNOSIS — G5621 Lesion of ulnar nerve, right upper limb: Secondary | ICD-10-CM | POA: Diagnosis not present

## 2017-03-18 DIAGNOSIS — Z87891 Personal history of nicotine dependence: Secondary | ICD-10-CM | POA: Diagnosis not present

## 2017-03-18 DIAGNOSIS — Z8051 Family history of malignant neoplasm of kidney: Secondary | ICD-10-CM | POA: Diagnosis not present

## 2017-03-18 DIAGNOSIS — Z7952 Long term (current) use of systemic steroids: Secondary | ICD-10-CM | POA: Diagnosis not present

## 2017-03-18 DIAGNOSIS — H9190 Unspecified hearing loss, unspecified ear: Secondary | ICD-10-CM | POA: Diagnosis not present

## 2017-03-18 DIAGNOSIS — Z7982 Long term (current) use of aspirin: Secondary | ICD-10-CM | POA: Diagnosis not present

## 2017-03-18 DIAGNOSIS — G8929 Other chronic pain: Secondary | ICD-10-CM | POA: Diagnosis not present

## 2017-03-18 DIAGNOSIS — I776 Arteritis, unspecified: Secondary | ICD-10-CM | POA: Diagnosis not present

## 2017-03-18 DIAGNOSIS — Z79899 Other long term (current) drug therapy: Secondary | ICD-10-CM | POA: Diagnosis not present

## 2017-03-18 DIAGNOSIS — K219 Gastro-esophageal reflux disease without esophagitis: Secondary | ICD-10-CM | POA: Diagnosis not present

## 2017-03-18 DIAGNOSIS — E785 Hyperlipidemia, unspecified: Secondary | ICD-10-CM | POA: Diagnosis not present

## 2017-03-18 DIAGNOSIS — Z8249 Family history of ischemic heart disease and other diseases of the circulatory system: Secondary | ICD-10-CM | POA: Diagnosis not present

## 2017-03-18 DIAGNOSIS — Z85528 Personal history of other malignant neoplasm of kidney: Secondary | ICD-10-CM | POA: Diagnosis not present

## 2017-03-18 DIAGNOSIS — L409 Psoriasis, unspecified: Secondary | ICD-10-CM | POA: Diagnosis not present

## 2017-03-18 DIAGNOSIS — M159 Polyosteoarthritis, unspecified: Secondary | ICD-10-CM | POA: Diagnosis not present

## 2017-03-18 DIAGNOSIS — M25512 Pain in left shoulder: Secondary | ICD-10-CM | POA: Diagnosis not present

## 2017-03-18 DIAGNOSIS — Z905 Acquired absence of kidney: Secondary | ICD-10-CM | POA: Diagnosis not present

## 2017-03-18 DIAGNOSIS — I10 Essential (primary) hypertension: Secondary | ICD-10-CM | POA: Diagnosis not present

## 2017-03-19 DIAGNOSIS — M791 Myalgia: Secondary | ICD-10-CM | POA: Diagnosis not present

## 2017-03-19 DIAGNOSIS — Z79899 Other long term (current) drug therapy: Secondary | ICD-10-CM | POA: Diagnosis not present

## 2017-03-20 ENCOUNTER — Other Ambulatory Visit: Payer: Self-pay | Admitting: Physician Assistant

## 2017-03-20 DIAGNOSIS — I10 Essential (primary) hypertension: Secondary | ICD-10-CM

## 2017-03-20 DIAGNOSIS — E785 Hyperlipidemia, unspecified: Secondary | ICD-10-CM

## 2017-03-25 DIAGNOSIS — L959 Vasculitis limited to the skin, unspecified: Secondary | ICD-10-CM | POA: Diagnosis not present

## 2017-03-25 DIAGNOSIS — L821 Other seborrheic keratosis: Secondary | ICD-10-CM | POA: Diagnosis not present

## 2017-03-25 DIAGNOSIS — L400 Psoriasis vulgaris: Secondary | ICD-10-CM | POA: Diagnosis not present

## 2017-03-25 DIAGNOSIS — L814 Other melanin hyperpigmentation: Secondary | ICD-10-CM | POA: Diagnosis not present

## 2017-03-25 DIAGNOSIS — D1801 Hemangioma of skin and subcutaneous tissue: Secondary | ICD-10-CM | POA: Diagnosis not present

## 2017-03-26 ENCOUNTER — Other Ambulatory Visit: Payer: Self-pay | Admitting: Family Medicine

## 2017-03-26 DIAGNOSIS — I10 Essential (primary) hypertension: Secondary | ICD-10-CM

## 2017-04-10 ENCOUNTER — Other Ambulatory Visit: Payer: Self-pay | Admitting: Family Medicine

## 2017-04-10 DIAGNOSIS — I10 Essential (primary) hypertension: Secondary | ICD-10-CM

## 2017-04-13 ENCOUNTER — Other Ambulatory Visit: Payer: Self-pay | Admitting: Physician Assistant

## 2017-04-13 DIAGNOSIS — G4700 Insomnia, unspecified: Secondary | ICD-10-CM

## 2017-04-13 NOTE — Telephone Encounter (Signed)
Please advise/refill for amitriptyline

## 2017-04-15 DIAGNOSIS — Z79899 Other long term (current) drug therapy: Secondary | ICD-10-CM | POA: Diagnosis not present

## 2017-04-15 DIAGNOSIS — M7918 Myalgia, other site: Secondary | ICD-10-CM | POA: Diagnosis not present

## 2017-04-16 NOTE — Telephone Encounter (Signed)
Pt is needing to get this as soon as possible as he is completely out and he needs this to sleep   Best number [phone]

## 2017-04-17 ENCOUNTER — Other Ambulatory Visit: Payer: Self-pay | Admitting: Family Medicine

## 2017-04-17 DIAGNOSIS — I10 Essential (primary) hypertension: Secondary | ICD-10-CM

## 2017-04-17 DIAGNOSIS — G4700 Insomnia, unspecified: Secondary | ICD-10-CM

## 2017-04-17 NOTE — Telephone Encounter (Signed)
Pt. Called in 04/17/17 to check on the status of their Amitriptyline RX. Pt. Stated that he had his pharmacy request RX and stated that he had also called on Thursday of this week.   Pt stated that he will be out of the medication and will need the RX ASAP. I advised pt  Please advise.

## 2017-04-23 ENCOUNTER — Ambulatory Visit (INDEPENDENT_AMBULATORY_CARE_PROVIDER_SITE_OTHER): Payer: Medicare Other | Admitting: Family Medicine

## 2017-04-23 ENCOUNTER — Encounter: Payer: Self-pay | Admitting: Family Medicine

## 2017-04-23 DIAGNOSIS — G4700 Insomnia, unspecified: Secondary | ICD-10-CM | POA: Diagnosis not present

## 2017-04-23 DIAGNOSIS — E782 Mixed hyperlipidemia: Secondary | ICD-10-CM | POA: Diagnosis not present

## 2017-04-23 DIAGNOSIS — I10 Essential (primary) hypertension: Secondary | ICD-10-CM

## 2017-04-23 MED ORDER — HYDROCHLOROTHIAZIDE 25 MG PO TABS: 25.0000 mg | ORAL_TABLET | Freq: Every day | ORAL | 3 refills | Status: DC

## 2017-04-23 MED ORDER — METOPROLOL SUCCINATE ER 25 MG PO TB24: 25.0000 mg | ORAL_TABLET | Freq: Every day | ORAL | 3 refills | Status: DC

## 2017-04-23 MED ORDER — LISINOPRIL 40 MG PO TABS: 40.0000 mg | ORAL_TABLET | Freq: Every day | ORAL | 3 refills | Status: DC

## 2017-04-23 MED ORDER — AMITRIPTYLINE HCL 25 MG PO TABS: 50.0000 mg | ORAL_TABLET | Freq: Every evening | ORAL | 3 refills | Status: DC | PRN

## 2017-04-23 MED ORDER — ATORVASTATIN CALCIUM 20 MG PO TABS: 20.0000 mg | ORAL_TABLET | Freq: Every day | ORAL | 3 refills | Status: DC

## 2017-04-23 NOTE — Progress Notes (Signed)
Subjective:  By signing my name below, I, Johnny Bryant, attest that this documentation has been prepared under the direction and in the presence of Johnny Ray, MD. Electronically Signed: [name], [place]. 04/23/2017 , 11:30 AM .  Patient was seen in Room 9 .   Patient ID: Johnny Bryant, male    DOB: 02/13/1951, 66 y.o.   MRN: [id number] Chief Complaint  Patient presents with  . Medication Refill    elavil,lisinopril,liptor   HPI Johnny Bryant is a 66 y.o. male  Here for follow up. His last office visit was in Sept 2017. He has history of multiple medical problems including vasculitis, HTN, hyperlipidemia, GERD and renal cancer. 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   Renal cancer s/p left nephrectomy He had left nephrectomy done in Aug 2015. His urology visit in May with Armando Bryant, at [facility] urology for 3 years surveillance. Planned for annual BMP, treated by urology for hypogonadism and erectile dysfunction. His urologist is Dr. Lottie Bryant.   Vasculitis He is followed by Johnny Bryant rheumatology, Dr. Meda Bryant, for ANCA associated vasculitis. He is continued on methotrexate 20mg  per week. His prednisone was decreased to alternating 5mg  and 2.5mg  every other day. Planned on recheck in 6 months.   Ortho It appears he had cubital tunnel syndrome with Dr. Elodia Bryant in June earlier this year.   HTN He takes Lisinopril 40mg  QD, HCTZ 25mg  QD and metoprolol 25mg  QD.   He's been taking metoprolol about half the time. His BP has been ranging in 130-140/80s. He denies any new side effects with his medications. He had CMP done on Sept 13th that was normal through [facility] and creatinine was 0.91. He denies chest pain, chest tightness, shortness of breath, lightheadedness or dizziness.   Hyperlipidemia Lab Results  Component Value Date   CHOL 166 03/19/2016   HDL 32 (L) 03/19/2016   LDLCALC 114 03/19/2016   TRIG 99 03/19/2016   CHOLHDL 5.2 (H) 03/19/2016   Lab Results  Component Value Date   ALT  16 03/19/2016   AST 19 03/19/2016   ALKPHOS 63 03/19/2016   BILITOT 0.3 03/19/2016   He's taking Lipitor 20mg  QD. He denies any complications or side effects.   He had lipid panel done at [facility] on Sept 13th:  Total cholesterol: 168, Trig 215, HDL 36, and LDL 89.   He walks over 2 miles a day.   GERD He's followed by GI for GERD and subsequent hoarseness with irritation of vocal chords on laryngoscopy in 2016; referred to new GI at last visit in March 2017. His GI is Dr. Loletha Bryant.   Insomnia He's taking elavil 50mg  QHS in the past with relief. He denies complications or side effects.   Patient Active Problem List   Diagnosis Date Noted  . Aneurysm, cerebral, nonruptured 11/18/2015  . 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 Esophageal reflux 02/20/2015  . Hoarseness 02/20/2015  . Abdominal pain, epigastric 02/20/2015  . Difficulty hearing 09/28/2014  . Abnormal weight loss 09/28/2014  . Essential (primary) hypertension 08/02/2014  . Gastro-esophageal reflux disease without esophagitis 08/02/2014  . Hereditary and idiopathic peripheral neuropathy 04/25/2014  . Vasculitis ([place]) 12/25/2013  . Left renal mass 11/09/2013  . Palpitation 10/31/2013  . Sudden hearing loss, unspecified 09/28/2013  . Sensorineural hearing loss, unspecified 09/28/2013  . Deafness, sensorineural 09/28/2013  . Chronic pain syndrome 08/18/2013  . Hematuria 08/18/2013  . Heme positive stool 08/18/2013   Past Medical History:  Diagnosis Date  . Arthritis   . Chronic  back pain   . GERD (gastroesophageal reflux disease)   . History of kidney cancer    left  . Hypercholesteremia   . Hypertension   . Vasculitis [facility])    Past Surgical History:  Procedure Laterality Date  . COLONOSCOPY    . COSMETIC SURGERY    . ELBOW SURGERY Right   . FRACTURE SURGERY Left    wrist  . kidney cancer surgery Left 02/2014   Dr. Frances Furbish at [facility]  . SHOULDER SURGERY Left    x 3  . VASECTOMY     Allergies  Allergen Reactions  .  Cymbalta [Duloxetine Hcl] Other (See Comments) and Anxiety    Cranky and moods changes   . Peanut Oil Rash   Prior to Admission medications   Medication Sig Start Date End Date Taking? Authorizing Provider  amitriptyline (ELAVIL) 25 MG tablet TAKE 2 TABLETS (50 MG TOTAL) BY MOUTH AT BEDTIME AS NEEDED FOR SLEEP. 10/18/16  Yes [name], MD  amitriptyline (ELAVIL) 25 MG tablet TAKE 2 TABLETS (50 MG TOTAL) BY MOUTH AT BEDTIME AS NEEDED FOR SLEEP. 03/15/17  Yes Weber, Sarah L, PA-C  atorvastatin (LIPITOR) 20 MG tablet TAKE 1 TABLET (20 MG TOTAL) BY MOUTH DAILY. 09/17/16  Yes Jeffery, Chelle, PA-C  b complex vitamins tablet Take 1 tablet by mouth daily.   Yes [provider]  Calcium Carbonate-Vitamin D (CALCIUM 500 + D) 500-125 MG-UNIT TABS Take 1,000 mg by mouth.    Yes [provider]  DEXILANT 60 MG capsule TAKE 1 CAPSULE BY MOUTH DAILY. TAKE WITH EVENING MEAL 11/03/16  Yes Danis, Estill Cotta III, MD  gabapentin (NEURONTIN) 600 MG tablet Take 800 mg by mouth 4 (four) times daily.    Yes [provider]  hydrochlorothiazide (HYDRODIURIL) 25 MG tablet TAKE 1 TABLET (25 MG TOTAL) BY MOUTH DAILY. 09/17/16  Yes Jeffery, Chelle, PA-C  lisinopril (PRINIVIL,ZESTRIL) 40 MG tablet TAKE 1 TABLET (40 MG TOTAL) BY MOUTH DAILY. 03/05/17  Yes [name], MD  MAGNESIUM PO Take 1 tablet by mouth daily. 500mg  tablet size   Yes [provider]  methotrexate (50 MG/ML) 1 G injection Inject into the vein once. Takes 8 cc weekly   Yes [provider]  metoprolol succinate (TOPROL-XL) 25 MG 24 hr tablet TAKE 1 TABLET (25 MG TOTAL) BY MOUTH DAILY. 09/17/16  Yes Jeffery, Domingo Mend, PA-C  Misc Natural Products (TURMERIC CURCUMIN) CAPS Take 1 capsule by mouth daily.   Yes [provider]  Multiple Vitamin (MULTIVITAMIN WITH MINERALS) TABS tablet Take 1 tablet by mouth daily.   Yes [provider]  Oxycodone HCl 10 MG TABS Take 10 mg by mouth 3 (three) times  daily.  03/15/14  Yes [provider]  Potassium 99 MG TABS Take by mouth daily.    Yes [provider]  pravastatin (PRAVACHOL) 20 MG tablet TAKE ONE TABLET BY MOUTH ONE TIME DAILY 04/12/16  Yes [name], MD  triamcinolone cream (KENALOG) 0.1 % Apply 1 application topically 2 (two) times daily. 09/04/15  Yes [name], MD  zinc gluconate 50 MG tablet Take 50 mg by mouth daily.   Yes [provider]   Social History   Social History  . Marital status: Significant Other    Spouse name: N/A  . Number of children: 2  . Years of education: N/A   Occupational History  . sales    Social History Main Topics  . Smoking status: Former  Smoker    Types: Cigarettes    Quit date: 11/19/1979  . Smokeless tobacco: Never Used  . Alcohol use No  . Drug use: No  . Sexual activity: Not on file   Other Topics Concern  . Not on file   Social History Narrative  . No narrative on file   Review of Systems  Constitutional: Negative for fatigue and unexpected weight change.  Eyes: Negative for visual disturbance.  Respiratory: Negative for cough, chest tightness and shortness of breath.   Cardiovascular: Negative for chest pain, palpitations and leg swelling.  Gastrointestinal: Negative for abdominal pain and Bryant in stool.  Neurological: Negative for dizziness, light-headedness and headaches.       Objective:   Physical Exam  Constitutional: He is oriented to person, place, and time. He appears well-developed and well-nourished.  HENT:  Head: Normocephalic and atraumatic.  Eyes: Pupils are equal, round, and reactive to light. EOM are normal.  Neck: No JVD present. Carotid bruit is not present.  Cardiovascular: Normal rate, regular rhythm and normal heart sounds.   No murmur heard. Pulmonary/Chest: Effort normal and breath sounds normal. He has no rales.  Musculoskeletal: He exhibits no edema.  Neurological: He is alert and oriented to person, place,  and time.  Skin: Skin is warm and dry.  Psychiatric: He has a normal mood and affect.  Vitals reviewed.   Vitals:   04/23/17 1053  BP: 138/86  Pulse: (!) 104  Resp: 17  Temp: 99.1 F (37.3 C)  TempSrc: Oral  SpO2: 98%  Weight: 176 lb (79.8 kg)  Height: 5\' 9"  (1.753 m)      Assessment & Plan:   Suhail Peloquin is a 66 y.o. male  Insomnia, unspecified type - Plan: amitriptyline (ELAVIL) 25 MG tablet  -Well controlled with Elavil without side effects, continue same dose of 25-50 mg daily at bedtime when necessary.  Mixed hyperlipidemia - Plan: atorvastatin (LIPITOR) 20 MG tablet  -Tolerating Lipitor, recent labs reviewed. Continue same dose.   Essential hypertension - Plan: hydrochlorothiazide (HYDRODIURIL) 25 MG tablet, lisinopril (PRINIVIL,ZESTRIL) 40 MG tablet, metoprolol succinate (TOPROL-XL) 25 MG 24 hr tablet  -Stable on current regimen, refilled same doses. Recent labs reviewed.  Maintenance  -appears to be due for Prevnar vaccine. If he has not had that done through [facility], recommended return for that injection as long as that is okay with his rheumatologist.  Other Chronic medical problems as listed above followed by specialists at [facility]. Other providers notes were reviewed.    Meds ordered this encounter  Medications  . amitriptyline (ELAVIL) 25 MG tablet    Sig: Take 2 tablets (50 mg total) by mouth at bedtime as needed for sleep.    Dispense:  180 tablet    Refill:  3  . atorvastatin (LIPITOR) 20 MG tablet    Sig: Take 1 tablet (20 mg total) by mouth daily.    Dispense:  90 tablet    Refill:  3  . hydrochlorothiazide (HYDRODIURIL) 25 MG tablet    Sig: Take 1 tablet (25 mg total) by mouth daily.    Dispense:  90 tablet    Refill:  3  . lisinopril (PRINIVIL,ZESTRIL) 40 MG tablet    Sig: Take 1 tablet (40 mg total) by mouth daily.    Dispense:  90 tablet    Refill:  3  . metoprolol succinate (TOPROL-XL) 25 MG 24 hr tablet    Sig: Take 1 tablet (25 mg total) by  mouth daily.  Dispense:  90 tablet    Refill:  3   Patient Instructions   If Bryant pressure is running over 130/80, you should take toprol every day.   No change in meds for now.   I would recommend Prevnar vaccine if you have not had that given through [facility]. Ask your rheumatologist if you have had that vaccine and if ok to have here if not.    IF you received an x-Bryant today, you will receive an invoice from [facility]. Please contact [facility] at [phone] with questions or concerns regarding your invoice.   IF you received labwork today, you will receive an invoice from [place]. Please contact [facility] at [phone] with questions or concerns regarding your invoice.   Our billing staff will not be able to assist you with questions regarding bills from these companies.  You will be contacted with the lab results as soon as they are available. The fastest way to get your results is to activate your My Chart account. Instructions are located on the last page of this paperwork. If you have not heard from [place] regarding the results in 2 weeks, please contact this office.       I personally performed the services described in this documentation, which was scribed in my presence. The recorded information has been reviewed and considered for accuracy and completeness, addended by me as needed, and agree with information above.  Signed,   Johnny Ray, MD [facility].  04/25/17 11:13 AM

## 2017-04-23 NOTE — Patient Instructions (Addendum)
If blood pressure is running over 130/80, you should take toprol every day.   No change in meds for now.   I would recommend Prevnar vaccine if you have not had that given through [facility]. Ask your rheumatologist if you have had that vaccine and if ok to have here if not.    IF you received an x-ray today, you will receive an invoice from [facility]. Please contact [facility] at [phone] with questions or concerns regarding your invoice.   IF you received labwork today, you will receive an invoice from [place]. Please contact [facility] at [phone] with questions or concerns regarding your invoice.   Our billing staff will not be able to assist you with questions regarding bills from these companies.  You will be contacted with the lab results as soon as they are available. The fastest way to get your results is to activate your My Chart account. Instructions are located on the last page of this paperwork. If you have not heard from [place] regarding the results in 2 weeks, please contact this office.

## 2017-05-12 DIAGNOSIS — Z7952 Long term (current) use of systemic steroids: Secondary | ICD-10-CM | POA: Diagnosis not present

## 2017-05-12 DIAGNOSIS — Z7902 Long term (current) use of antithrombotics/antiplatelets: Secondary | ICD-10-CM | POA: Diagnosis not present

## 2017-05-12 DIAGNOSIS — Z905 Acquired absence of kidney: Secondary | ICD-10-CM | POA: Diagnosis not present

## 2017-05-12 DIAGNOSIS — Z7982 Long term (current) use of aspirin: Secondary | ICD-10-CM | POA: Diagnosis not present

## 2017-05-12 DIAGNOSIS — N521 Erectile dysfunction due to diseases classified elsewhere: Secondary | ICD-10-CM | POA: Diagnosis not present

## 2017-05-12 DIAGNOSIS — C649 Malignant neoplasm of unspecified kidney, except renal pelvis: Secondary | ICD-10-CM | POA: Diagnosis not present

## 2017-05-12 DIAGNOSIS — E291 Testicular hypofunction: Secondary | ICD-10-CM | POA: Diagnosis not present

## 2017-05-14 DIAGNOSIS — M7918 Myalgia, other site: Secondary | ICD-10-CM | POA: Diagnosis not present

## 2017-05-14 DIAGNOSIS — Z79899 Other long term (current) drug therapy: Secondary | ICD-10-CM | POA: Diagnosis not present

## 2017-06-11 DIAGNOSIS — M7918 Myalgia, other site: Secondary | ICD-10-CM | POA: Diagnosis not present

## 2017-06-11 DIAGNOSIS — Z79899 Other long term (current) drug therapy: Secondary | ICD-10-CM | POA: Diagnosis not present

## 2017-07-09 DIAGNOSIS — Z79899 Other long term (current) drug therapy: Secondary | ICD-10-CM | POA: Diagnosis not present

## 2017-07-09 DIAGNOSIS — M7918 Myalgia, other site: Secondary | ICD-10-CM | POA: Diagnosis not present

## 2017-08-06 DIAGNOSIS — M7918 Myalgia, other site: Secondary | ICD-10-CM | POA: Diagnosis not present

## 2017-08-06 DIAGNOSIS — Z79899 Other long term (current) drug therapy: Secondary | ICD-10-CM | POA: Diagnosis not present

## 2017-09-03 DIAGNOSIS — M7918 Myalgia, other site: Secondary | ICD-10-CM | POA: Diagnosis not present

## 2017-09-03 DIAGNOSIS — Z79899 Other long term (current) drug therapy: Secondary | ICD-10-CM | POA: Diagnosis not present

## 2017-09-03 DIAGNOSIS — G4709 Other insomnia: Secondary | ICD-10-CM | POA: Diagnosis not present

## 2017-10-04 DIAGNOSIS — M7918 Myalgia, other site: Secondary | ICD-10-CM | POA: Diagnosis not present

## 2017-10-04 DIAGNOSIS — G4709 Other insomnia: Secondary | ICD-10-CM | POA: Diagnosis not present

## 2017-10-04 DIAGNOSIS — Z79899 Other long term (current) drug therapy: Secondary | ICD-10-CM | POA: Diagnosis not present

## 2017-11-02 DIAGNOSIS — M7918 Myalgia, other site: Secondary | ICD-10-CM | POA: Diagnosis not present

## 2017-11-02 DIAGNOSIS — Z79899 Other long term (current) drug therapy: Secondary | ICD-10-CM | POA: Diagnosis not present

## 2017-11-11 DIAGNOSIS — Z972 Presence of dental prosthetic device (complete) (partial): Secondary | ICD-10-CM | POA: Diagnosis not present

## 2017-11-11 DIAGNOSIS — M810 Age-related osteoporosis without current pathological fracture: Secondary | ICD-10-CM | POA: Diagnosis not present

## 2017-11-11 DIAGNOSIS — R739 Hyperglycemia, unspecified: Secondary | ICD-10-CM | POA: Diagnosis not present

## 2017-11-11 DIAGNOSIS — H9190 Unspecified hearing loss, unspecified ear: Secondary | ICD-10-CM | POA: Diagnosis not present

## 2017-11-11 DIAGNOSIS — R0600 Dyspnea, unspecified: Secondary | ICD-10-CM | POA: Diagnosis not present

## 2017-11-11 DIAGNOSIS — Z7952 Long term (current) use of systemic steroids: Secondary | ICD-10-CM | POA: Diagnosis not present

## 2017-11-11 DIAGNOSIS — Z905 Acquired absence of kidney: Secondary | ICD-10-CM | POA: Diagnosis not present

## 2017-11-11 DIAGNOSIS — E119 Type 2 diabetes mellitus without complications: Secondary | ICD-10-CM | POA: Diagnosis not present

## 2017-11-11 DIAGNOSIS — I10 Essential (primary) hypertension: Secondary | ICD-10-CM | POA: Diagnosis not present

## 2017-11-11 DIAGNOSIS — E785 Hyperlipidemia, unspecified: Secondary | ICD-10-CM | POA: Diagnosis not present

## 2017-11-11 DIAGNOSIS — Z7982 Long term (current) use of aspirin: Secondary | ICD-10-CM | POA: Diagnosis not present

## 2017-11-11 DIAGNOSIS — R634 Abnormal weight loss: Secondary | ICD-10-CM | POA: Diagnosis not present

## 2017-11-11 DIAGNOSIS — M1991 Primary osteoarthritis, unspecified site: Secondary | ICD-10-CM | POA: Diagnosis not present

## 2017-11-11 DIAGNOSIS — Z87891 Personal history of nicotine dependence: Secondary | ICD-10-CM | POA: Diagnosis not present

## 2017-11-11 DIAGNOSIS — M2550 Pain in unspecified joint: Secondary | ICD-10-CM | POA: Diagnosis not present

## 2017-11-11 DIAGNOSIS — Z79899 Other long term (current) drug therapy: Secondary | ICD-10-CM | POA: Diagnosis not present

## 2017-11-11 DIAGNOSIS — R5383 Other fatigue: Secondary | ICD-10-CM | POA: Diagnosis not present

## 2017-11-11 DIAGNOSIS — R490 Dysphonia: Secondary | ICD-10-CM | POA: Diagnosis not present

## 2017-11-11 DIAGNOSIS — I776 Arteritis, unspecified: Secondary | ICD-10-CM | POA: Diagnosis not present

## 2017-11-24 DIAGNOSIS — Z905 Acquired absence of kidney: Secondary | ICD-10-CM | POA: Diagnosis not present

## 2017-11-24 DIAGNOSIS — I776 Arteritis, unspecified: Secondary | ICD-10-CM | POA: Diagnosis not present

## 2017-11-24 DIAGNOSIS — Z7952 Long term (current) use of systemic steroids: Secondary | ICD-10-CM | POA: Diagnosis not present

## 2017-11-24 DIAGNOSIS — R3914 Feeling of incomplete bladder emptying: Secondary | ICD-10-CM | POA: Diagnosis not present

## 2017-11-24 DIAGNOSIS — Z7982 Long term (current) use of aspirin: Secondary | ICD-10-CM | POA: Diagnosis not present

## 2017-11-24 DIAGNOSIS — N529 Male erectile dysfunction, unspecified: Secondary | ICD-10-CM | POA: Diagnosis not present

## 2017-11-24 DIAGNOSIS — N401 Enlarged prostate with lower urinary tract symptoms: Secondary | ICD-10-CM | POA: Diagnosis not present

## 2017-11-24 DIAGNOSIS — M810 Age-related osteoporosis without current pathological fracture: Secondary | ICD-10-CM | POA: Diagnosis not present

## 2017-11-24 DIAGNOSIS — C649 Malignant neoplasm of unspecified kidney, except renal pelvis: Secondary | ICD-10-CM | POA: Diagnosis not present

## 2017-11-24 DIAGNOSIS — E291 Testicular hypofunction: Secondary | ICD-10-CM | POA: Diagnosis not present

## 2017-11-30 DIAGNOSIS — Z79899 Other long term (current) drug therapy: Secondary | ICD-10-CM | POA: Diagnosis not present

## 2017-12-30 DIAGNOSIS — Z79899 Other long term (current) drug therapy: Secondary | ICD-10-CM | POA: Diagnosis not present

## 2018-01-27 DIAGNOSIS — Z6822 Body mass index (BMI) 22.0-22.9, adult: Secondary | ICD-10-CM | POA: Diagnosis not present

## 2018-01-27 DIAGNOSIS — Z79891 Long term (current) use of opiate analgesic: Secondary | ICD-10-CM | POA: Diagnosis not present

## 2018-01-27 DIAGNOSIS — M7918 Myalgia, other site: Secondary | ICD-10-CM | POA: Diagnosis not present

## 2018-01-27 DIAGNOSIS — Z79899 Other long term (current) drug therapy: Secondary | ICD-10-CM | POA: Diagnosis not present

## 2018-02-25 DIAGNOSIS — Z6822 Body mass index (BMI) 22.0-22.9, adult: Secondary | ICD-10-CM | POA: Diagnosis not present

## 2018-02-25 DIAGNOSIS — M7918 Myalgia, other site: Secondary | ICD-10-CM | POA: Diagnosis not present

## 2018-02-25 DIAGNOSIS — G4709 Other insomnia: Secondary | ICD-10-CM | POA: Diagnosis not present

## 2018-02-25 DIAGNOSIS — Z79899 Other long term (current) drug therapy: Secondary | ICD-10-CM | POA: Diagnosis not present

## 2018-02-25 DIAGNOSIS — Z79891 Long term (current) use of opiate analgesic: Secondary | ICD-10-CM | POA: Diagnosis not present

## 2018-03-26 DIAGNOSIS — M7918 Myalgia, other site: Secondary | ICD-10-CM | POA: Diagnosis not present

## 2018-03-26 DIAGNOSIS — G4709 Other insomnia: Secondary | ICD-10-CM | POA: Diagnosis not present

## 2018-03-26 DIAGNOSIS — Z79899 Other long term (current) drug therapy: Secondary | ICD-10-CM | POA: Diagnosis not present

## 2018-03-26 DIAGNOSIS — Z6822 Body mass index (BMI) 22.0-22.9, adult: Secondary | ICD-10-CM | POA: Diagnosis not present

## 2018-04-20 DIAGNOSIS — C642 Malignant neoplasm of left kidney, except renal pelvis: Secondary | ICD-10-CM | POA: Diagnosis not present

## 2018-04-20 DIAGNOSIS — C652 Malignant neoplasm of left renal pelvis: Secondary | ICD-10-CM | POA: Diagnosis not present

## 2018-04-20 DIAGNOSIS — M545 Low back pain: Secondary | ICD-10-CM | POA: Diagnosis not present

## 2018-04-20 DIAGNOSIS — Z85528 Personal history of other malignant neoplasm of kidney: Secondary | ICD-10-CM | POA: Diagnosis not present

## 2018-04-20 DIAGNOSIS — Z905 Acquired absence of kidney: Secondary | ICD-10-CM | POA: Diagnosis not present

## 2018-04-20 DIAGNOSIS — K8020 Calculus of gallbladder without cholecystitis without obstruction: Secondary | ICD-10-CM | POA: Diagnosis not present

## 2018-04-25 DIAGNOSIS — M7918 Myalgia, other site: Secondary | ICD-10-CM | POA: Diagnosis not present

## 2018-04-25 DIAGNOSIS — Z79899 Other long term (current) drug therapy: Secondary | ICD-10-CM | POA: Diagnosis not present

## 2018-04-25 DIAGNOSIS — Z79891 Long term (current) use of opiate analgesic: Secondary | ICD-10-CM | POA: Diagnosis not present

## 2018-04-25 DIAGNOSIS — G4709 Other insomnia: Secondary | ICD-10-CM | POA: Diagnosis not present

## 2018-05-24 DIAGNOSIS — Z6823 Body mass index (BMI) 23.0-23.9, adult: Secondary | ICD-10-CM | POA: Diagnosis not present

## 2018-05-24 DIAGNOSIS — Z79899 Other long term (current) drug therapy: Secondary | ICD-10-CM | POA: Diagnosis not present

## 2018-05-24 DIAGNOSIS — Z79891 Long term (current) use of opiate analgesic: Secondary | ICD-10-CM | POA: Diagnosis not present

## 2018-05-24 DIAGNOSIS — M7918 Myalgia, other site: Secondary | ICD-10-CM | POA: Diagnosis not present

## 2018-06-08 DIAGNOSIS — E291 Testicular hypofunction: Secondary | ICD-10-CM | POA: Diagnosis not present

## 2018-06-09 DIAGNOSIS — Z79899 Other long term (current) drug therapy: Secondary | ICD-10-CM | POA: Diagnosis not present

## 2018-06-09 DIAGNOSIS — M25542 Pain in joints of left hand: Secondary | ICD-10-CM | POA: Diagnosis not present

## 2018-06-09 DIAGNOSIS — M25541 Pain in joints of right hand: Secondary | ICD-10-CM | POA: Diagnosis not present

## 2018-06-09 DIAGNOSIS — E291 Testicular hypofunction: Secondary | ICD-10-CM | POA: Diagnosis not present

## 2018-06-09 DIAGNOSIS — I776 Arteritis, unspecified: Secondary | ICD-10-CM | POA: Diagnosis not present

## 2018-06-21 DIAGNOSIS — Z79899 Other long term (current) drug therapy: Secondary | ICD-10-CM | POA: Diagnosis not present

## 2018-06-24 ENCOUNTER — Other Ambulatory Visit: Payer: Self-pay | Admitting: Family Medicine

## 2018-06-24 DIAGNOSIS — I10 Essential (primary) hypertension: Secondary | ICD-10-CM

## 2018-06-24 NOTE — Telephone Encounter (Signed)
Requested Prescriptions  Pending Prescriptions Disp Refills  . lisinopril (PRINIVIL,ZESTRIL) 40 MG tablet [Pharmacy Med Name: LISINOPRIL 40 MG TABLET] 90 tablet 3    Sig: TAKE 1 TABLET BY MOUTH EVERY DAY     Cardiovascular:  ACE Inhibitors Failed - 06/24/2018  9:52 AM      Failed - Cr in normal range and within 180 days    Creat  Date Value Ref Range Status  03/19/2016 0.99 0.70 - 1.25 mg/dL Final    Comment:      For patients > or = 68 years of age: The upper reference limit for Creatinine is approximately 13% higher for people identified as African-American.            Failed - K in normal range and within 180 days    Potassium  Date Value Ref Range Status  03/19/2016 4.6 3.5 - 5.3 mmol/L Final         Failed - Valid encounter within last 6 months    Recent Outpatient Visits          1 year ago Insomnia, unspecified type   [facility], Ranell Patrick, MD   2 years ago Hyperlipidemia   [facility], Ranell Patrick, MD   2 years ago Essential hypertension   [facility], Ranell Patrick, MD   3 years ago Gastroesophageal reflux disease with esophagitis   [facility], Ranell Patrick, MD   4 years ago Essential hypertension   [facility], Ranell Patrick, MD             Passed - Patient is not pregnant      Passed - Last BP in normal range    BP Readings from Last 1 Encounters:  04/23/17 138/86

## 2018-07-01 ENCOUNTER — Other Ambulatory Visit: Payer: Self-pay | Admitting: Family Medicine

## 2018-07-01 DIAGNOSIS — G4700 Insomnia, unspecified: Secondary | ICD-10-CM

## 2018-07-01 NOTE — Telephone Encounter (Signed)
Requested medication (s) are due for refill today: yes  Requested medication (s) are on the active medication list: yes  Last refill:  04/23/17   #180  3 refills  Future visit scheduled: yes  Notes to clinic:  Expired RX    Requested Prescriptions  Pending Prescriptions Disp Refills   amitriptyline (ELAVIL) 25 MG tablet [Pharmacy Med Name: AMITRIPTYLINE HCL 25 MG TAB] 180 tablet 3    Sig: Take 2 tablets (50 mg total) by mouth at bedtime as needed for sleep.     Psychiatry:  Antidepressants - Heterocyclics (TCAs) Failed - 07/01/2018 11:22 AM      Failed - Valid encounter within last 6 months    Recent Outpatient Visits          1 year ago Insomnia, unspecified type   [facility], Ranell Patrick, MD   2 years ago Hyperlipidemia   [facility], Ranell Patrick, MD   2 years ago Essential hypertension   [facility], Ranell Patrick, MD   3 years ago Gastroesophageal reflux disease with esophagitis   [facility], Ranell Patrick, MD   4 years ago Essential hypertension   [facility], Ranell Patrick, MD      Future Appointments            In 1 month Carlota Raspberry Ranell Patrick, MD [facility] [place], [facility]

## 2018-07-01 NOTE — Telephone Encounter (Signed)
Call placed to patient.  Scheduled for CPE 08/03/18 with Dr Carlota Raspberry.

## 2018-07-04 ENCOUNTER — Telehealth: Payer: Self-pay | Admitting: Family Medicine

## 2018-07-04 NOTE — Telephone Encounter (Signed)
Elavil refilled temporarily, but is due for office visit.  That is scheduled for January 29.

## 2018-07-04 NOTE — Telephone Encounter (Signed)
Please advise 

## 2018-07-04 NOTE — Telephone Encounter (Signed)
Called and spoke with pt regarding an appt to get his ears cleaned. Pt states that he needs a referral to an ENT, I advised that we clean ears here and he said that he would talk with Dr. Carlota Raspberry on his appt day 08/03/18.

## 2018-07-19 DIAGNOSIS — Z79899 Other long term (current) drug therapy: Secondary | ICD-10-CM | POA: Diagnosis not present

## 2018-08-03 ENCOUNTER — Encounter: Payer: Self-pay | Admitting: Family Medicine

## 2018-08-03 ENCOUNTER — Ambulatory Visit (INDEPENDENT_AMBULATORY_CARE_PROVIDER_SITE_OTHER): Payer: Medicare Other | Admitting: Family Medicine

## 2018-08-03 ENCOUNTER — Other Ambulatory Visit: Payer: Self-pay

## 2018-08-03 ENCOUNTER — Telehealth: Payer: Self-pay | Admitting: *Deleted

## 2018-08-03 VITALS — BP 138/72 | HR 98 | Temp 98.6°F | Resp 16 | Ht 69.0 in | Wt 167.0 lb

## 2018-08-03 DIAGNOSIS — G4700 Insomnia, unspecified: Secondary | ICD-10-CM

## 2018-08-03 DIAGNOSIS — E782 Mixed hyperlipidemia: Secondary | ICD-10-CM | POA: Diagnosis not present

## 2018-08-03 DIAGNOSIS — I10 Essential (primary) hypertension: Secondary | ICD-10-CM | POA: Diagnosis not present

## 2018-08-03 DIAGNOSIS — Z299 Encounter for prophylactic measures, unspecified: Secondary | ICD-10-CM

## 2018-08-03 DIAGNOSIS — H6123 Impacted cerumen, bilateral: Secondary | ICD-10-CM

## 2018-08-03 DIAGNOSIS — Z23 Encounter for immunization: Secondary | ICD-10-CM

## 2018-08-03 DIAGNOSIS — Z0001 Encounter for general adult medical examination with abnormal findings: Secondary | ICD-10-CM | POA: Diagnosis not present

## 2018-08-03 DIAGNOSIS — Z0000 Encounter for general adult medical examination without abnormal findings: Secondary | ICD-10-CM

## 2018-08-03 MED ORDER — HYDROCHLOROTHIAZIDE 25 MG PO TABS: 25.0000 mg | ORAL_TABLET | Freq: Every day | ORAL | 3 refills | Status: AC

## 2018-08-03 MED ORDER — AMITRIPTYLINE HCL 50 MG PO TABS: 50.0000 mg | ORAL_TABLET | Freq: Every evening | ORAL | 3 refills | Status: AC | PRN

## 2018-08-03 MED ORDER — LISINOPRIL 40 MG PO TABS: 40.0000 mg | ORAL_TABLET | Freq: Every day | ORAL | 3 refills | Status: AC

## 2018-08-03 MED ORDER — ATORVASTATIN CALCIUM 20 MG PO TABS: 20.0000 mg | ORAL_TABLET | Freq: Every day | ORAL | 3 refills | Status: AC

## 2018-08-03 NOTE — Telephone Encounter (Signed)
08/03/2018 - PATIENT WAS SEEN BY DR. Carlota Raspberry ON 08/03/2018. HE HAS REQUESTED PATIENT TO HAVE AN ANNUAL MEDICARE WELLNESS EXAM IN 1 YEAR. BEST PHONE [phone] (CELL) [place]

## 2018-08-03 NOTE — Progress Notes (Signed)
Subjective:    Patient ID: Johnny Bryant, male    DOB: 12-09-1950, 68 y.o.   MRN: [id number]  HPI Johnny Bryant is a 68 y.o. male Presents today for: Chief Complaint  Patient presents with  . Medicare Wellness    Need ear cleaned out   History of multiple medical problems per problem list.  He is followed by [facility] u[facility], rheumatology every 6 months with history of ANCA positive vasculitis, long-term methotrexate use, as well as alternating prednisone from 2.5 to 5 mg daily when last discussed December 5.  He has been treated with Flomax and testosterone for hypogonadism.  History of renal cancer status post left nephrectomy in August 2015.  I last saw him October 2018. On oxycodone 4 per day for chronic pain. - pain mgt  - Dr. Mee Hives.   Hypertension: BP Readings from Last 3 Encounters:  08/03/18 (!) 170/77  04/23/17 138/86  03/19/16 138/72   Lab Results  Component Value Date   CREATININE 0.99 03/19/2016  Creatinine 0.88 on June 09, 2018 through care everywhere BP at pain mgt around 130-140/70-90.   Has been taking metoprolol only about half the time when I saw him in 2018.  He was continued on lisinopril 40 mg daily, HCTZ 25 mg daily, Toprol 25 mg daily at that time.   Not taking toprol.   Taking lisinopril daily.  HCTZ 4-5 times per week - just forgets. Does have pill minder.   Insomnia: Has been treated well with Elavil 50 mg nightly in the past, still on same dose.  Feels like it helps with leg pains and sleep. No recent side effects.   Hyperlipidemia:  Lab Results  Component Value Date   CHOL 166 03/19/2016   HDL 32 (L) 03/19/2016   LDLCALC 114 03/19/2016   TRIG 99 03/19/2016   CHOLHDL 5.2 (H) 03/19/2016   Lab Results  Component Value Date   ALT 16 03/19/2016   AST 19 03/19/2016   ALKPHOS 63 03/19/2016   BILITOT 0.3 03/19/2016  Lipid panel on care everywhere through [facility] on May 9.  Total 208, trig 136, HDL 47, LDL 134 AST, ALT also normal at 19, 12, on  06/09/2018 through care everywhere.  No known new side effects. chronic joint pains with other medical issues.   Cancer screening:: Colonoscopy 11/15/2008 Prostate: Followed by urology. PSA 0.41 on 11/24/17.   Immunization History  Administered Date(s) Administered  . Pneumococcal Polysaccharide-23 11/21/2013  . Td 07/06/2010  due for prevnar - declines, then updated pneumovax, due for flu. shingrix - had one shingles vaccine in 2017 with rheumatologist - unknown type - he will verify.   Depression screen [facility] 2/9 08/03/2018 04/23/2017 03/19/2016 09/04/2015 01/24/2015  Decreased Interest 0 0 0 0 0  Down, Depressed, Hopeless 0 0 0 0 0  PHQ - 2 Score 0 0 0 0 0   Fall screening: Denies fall in past year.   Functional Status Survey: Is the patient deaf or have difficulty hearing?: Yes(hearing aids) Does the patient have difficulty seeing, even when wearing glasses/contacts?: No Does the patient have difficulty concentrating, remembering, or making decisions?: No Does the patient have difficulty walking or climbing stairs?: No Does the patient have difficulty dressing or bathing?: No Does the patient have difficulty doing errands alone such as visiting a doctor's office or shopping?: No Feels like ear wax is blocking ears/trouble hearing with hearing aids. Requests cleaning today. Hearing aid provider also recommended wax to be removed for improved hearing.  Memory screen: 6CIT Screen 08/03/2018  What Year? 0 points  What month? 0 points  What time? 0 points  Count back from 20 0 points  Months in reverse 0 points  Repeat phrase 0 points  Total Score 0    Vision  Visual Acuity Screening   Right eye Left eye Both eyes  Without correction:     With correction: _0  no recent optho. [facility] - 2-3 years.   Dental:  Has ongoing care, dentures- [facility].   Exercise: Back stretches, swimming at [facility], walking with dogs 2-3 mi per day.   Advanced  directives: Has living well/POA. Requesting copies.        Patient Active Problem List   Diagnosis Date Noted  . Aneurysm, cerebral, nonruptured 11/18/2015  . 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 Esophageal reflux 02/20/2015  . Hoarseness 02/20/2015  . Abdominal pain, epigastric 02/20/2015  . Difficulty hearing 09/28/2014  . Abnormal weight loss 09/28/2014  . Essential (primary) hypertension 08/02/2014  . Gastro-esophageal reflux disease without esophagitis 08/02/2014  . Hereditary and idiopathic peripheral neuropathy 04/25/2014  . Vasculitis ([place]) 12/25/2013  . Left renal mass 11/09/2013  . Palpitation 10/31/2013  . Sudden hearing loss, unspecified 09/28/2013  . Sensorineural hearing loss, unspecified 09/28/2013  . Deafness, sensorineural 09/28/2013  . Chronic pain syndrome 08/18/2013  . Hematuria 08/18/2013  . Heme positive stool 08/18/2013   Past Medical History:  Diagnosis Date  . Arthritis   . Chronic back pain   . GERD (gastroesophageal reflux disease)   . History of kidney cancer    left  . Hypercholesteremia   . Hypertension   . Vasculitis [facility])    Past Surgical History:  Procedure Laterality Date  . COLONOSCOPY    . COSMETIC SURGERY    . ELBOW SURGERY Right   . FRACTURE SURGERY Left    wrist  . kidney cancer surgery Left 02/2014   Dr. Frances Furbish at [facility]  . SHOULDER SURGERY Left    x 3  . VASECTOMY     Allergies  Allergen Reactions  . Cymbalta [Duloxetine Hcl] Other (See Comments) and Anxiety    Cranky and moods changes   . Peanut Oil Rash   Prior to Admission medications   Medication Sig Start Date End Date Taking? Authorizing Provider  amitriptyline (ELAVIL) 25 MG tablet TAKE 2 TABLETS (50 MG TOTAL) BY MOUTH AT BEDTIME AS NEEDED FOR SLEEP. 07/04/18   [name], MD  atorvastatin (LIPITOR) 20 MG tablet Take 1 tablet (20 mg total) by mouth daily. 04/23/17   [name], MD  b complex vitamins tablet Take 1 tablet by mouth daily.    [provider]  Calcium Carbonate-Vitamin D (CALCIUM 500 + D) 500-125 MG-UNIT TABS Take 1,000 mg by mouth.     [provider]  DEXILANT 60 MG capsule TAKE 1 CAPSULE BY MOUTH DAILY. TAKE WITH EVENING MEAL 11/03/16   Doran Stabler, MD  gabapentin (NEURONTIN) 600 MG tablet Take 800 mg by mouth 4 (four) times daily.     [provider]  hydrochlorothiazide (HYDRODIURIL) 25 MG tablet Take 1 tablet (25 mg total) by mouth daily. 04/23/17   [name], MD  lisinopril (PRINIVIL,ZESTRIL) 40 MG tablet TAKE 1 TABLET BY MOUTH EVERY DAY 06/24/18   [name], MD  MAGNESIUM PO Take 1 tablet by mouth daily. 534m tablet size    [provider]  methotrexate (50 MG/ML) 1 G injection Inject into the  vein once. Takes 8 cc weekly    [provider]  metoprolol succinate (TOPROL-XL) 25 MG 24 hr tablet Take 1 tablet (25 mg total) by mouth daily. 04/23/17   [name], MD  Misc Natural Products (TURMERIC CURCUMIN) CAPS Take 1 capsule by mouth daily.    [provider]  Multiple Vitamin (MULTIVITAMIN WITH MINERALS) TABS tablet Take 1 tablet by mouth daily.    [provider]  Oxycodone HCl 10 MG TABS Take 10 mg by mouth 3 (three) times daily.  03/15/14   [provider]  Potassium 99 MG TABS Take by mouth daily.     [provider]  triamcinolone cream (KENALOG) 0.1 % Apply 1 application topically 2 (two) times daily. 09/04/15   [name], MD  zinc gluconate 50 MG tablet Take 50 mg by mouth daily.    [provider]   Social History   Socioeconomic History  . Marital status: Significant Other    Spouse name: Not on file  . Number of children: 2  . Years of education: Not on file  . Highest education level: Not on file  Occupational History  . Occupation: Geographical information systems officer  . Financial resource strain: Not on file  . Food insecurity:    Worry: Not on file    Inability: Not on file  . Transportation  needs:    Medical: Not on file    Non-medical: Not on file  Tobacco Use  . Smoking status: Former Smoker    Types: Cigarettes    Last attempt to quit: 11/19/1979    Years since quitting: 38.7  . Smokeless tobacco: Never Used  Substance and Sexual Activity  . Alcohol use: No    Alcohol/week: 0.0 standard drinks  . Drug use: No  . Sexual activity: Not on file  Lifestyle  . Physical activity:    Days per week: Not on file    Minutes per session: Not on file  . Stress: Not on file  Relationships  . Social connections:    Talks on phone: Not on file    Gets together: Not on file    Attends religious service: Not on file    Active member of club or organization: Not on file    Attends meetings of clubs or organizations: Not on file    Relationship status: Not on file  . Intimate partner violence:    Fear of current or ex partner: Not on file    Emotionally abused: Not on file    Physically abused: Not on file    Forced sexual activity: Not on file  Other Topics Concern  . Not on file  Social History Narrative  . Not on file    Review of Systems 13 point review of systems per patient health survey noted.  Negative other than as indicated above or in HPI for acute issues.      Objective:   Physical Exam Vitals signs reviewed.  Constitutional:      Appearance: He is well-developed.  HENT:     Head: Normocephalic and atraumatic.     Right Ear: External ear normal. There is impacted cerumen.     Left Ear: External ear normal. There is impacted cerumen (Bilateral cerumen impaction, completely obstructing the left, partially obstructed on the right.).  Eyes:     Conjunctiva/sclera: Conjunctivae normal.     Pupils: Pupils are equal, round, and reactive to light.  Neck:     Musculoskeletal:  Normal range of motion and neck supple.     Thyroid: No thyromegaly.  Cardiovascular:     Rate and Rhythm: Normal rate and regular rhythm.     Heart sounds: Normal heart sounds.   Pulmonary:     Effort: Pulmonary effort is normal. No respiratory distress.     Breath sounds: Normal breath sounds. No wheezing.  Abdominal:     General: There is no distension.     Palpations: Abdomen is soft.     Tenderness: There is no abdominal tenderness.  Musculoskeletal: Normal range of motion.        General: No tenderness.  Lymphadenopathy:     Cervical: No cervical adenopathy.  Skin:    General: Skin is warm and dry.  Neurological:     Mental Status: He is alert and oriented to person, place, and time.     Deep Tendon Reflexes: Reflexes are normal and symmetric.  Psychiatric:        Behavior: Behavior normal.    Vitals:   08/03/18 1400 08/03/18 1505  BP: (!) 170/77 138/72  Pulse: 98   Resp: 16   Temp: 98.6 F (37 C)   TempSrc: Oral   SpO2: 98%   Weight: 167 lb (75.8 kg)   Height: _0  (1.753 m)    Cerumen removed with lavage bilaterally, no complications.  Canals are clear after procedure.  No sign of rupture     Assessment & Plan:    Johnny Bryant is a 68 y.o. male Medicare annual wellness visit, subsequent  - - anticipatory guidance as below in AVS, screening labs if needed. Health maintenance items as above in HPI discussed/recommended as applicable.  - no concerning responses on depression, fall, or functional status screening. Any positive responses noted as above. Advanced directives discussed as in CHL.   -Prevnar recommended, he deferred and would like to have this done at his pharmacy.  He will also check into shingles vaccination and to discuss Shingrix if he previously had Zostavax.   Need for prophylactic measure - Plan: Flu vaccine HIGH DOSE PF (Fluzone High dose)  Essential hypertension - Plan: lisinopril (PRINIVIL,ZESTRIL) 40 MG tablet, hydrochlorothiazide (HYDRODIURIL) 25 MG tablet  - borderline, but improved on repeat testing.  Recent labs including renal function noted from care everywhere.  Lab work deferred today.  Mixed  hyperlipidemia - Plan: atorvastatin (LIPITOR) 20 MG tablet  -Tolerating Lipitor, and most recent lipid panel reviewed.  Continue same, deferred blood work today.  Bilateral impacted cerumen  -Improved after cerumen lavage on both left and right ears without complications  Insomnia, unspecified type - Plan: amitriptyline (ELAVIL) 50 MG tablet  -Stable, continue Elavil at 50 mg daily.  Previously was taking 2 of the 25 mg, changed to 50 mg dose once per day.  Needs flu shot - Plan: Flu vaccine HIGH DOSE PF (Fluzone High dose)   Meds ordered this encounter  Medications  . lisinopril (PRINIVIL,ZESTRIL) 40 MG tablet    Sig: Take 1 tablet (40 mg total) by mouth daily.    Dispense:  90 tablet    Refill:  3    DX Code Needed  .  . hydrochlorothiazide (HYDRODIURIL) 25 MG tablet    Sig: Take 1 tablet (25 mg total) by mouth daily.    Dispense:  90 tablet    Refill:  3  . atorvastatin (LIPITOR) 20 MG tablet    Sig: Take 1 tablet (20 mg total) by mouth daily.    Dispense:  90 tablet    Refill:  3  . amitriptyline (ELAVIL) 50 MG tablet    Sig: Take 1 tablet (50 mg total) by mouth at bedtime as needed for sleep.    Dispense:  90 tablet    Refill:  3   Patient Instructions    Use pill minder for blood pressure meds as well.  Will stop toprol for now as not taking it, but Keep a record of your blood pressures outside of the office and return if over 140/90.   Call and schedule eye doctor appointment.   Recommend Prevnar vaccine followed by Pneumovax in the next 6 months.  Let me know if you would like to have this performed here  Cerumen impaction improved after lavage in office today.  If any increasing pain in your ears or discharge please return for recheck.  Flu vaccine given today.   Elavil changed to 72m - take 1 pill per night.   I do not have record of shingles vaccine, please check with your rheumatologist as to which 1 was given.  If you have had both Shingrix and then you  are good.  If you have not had Shingrix I would recommend that type of shingles vaccine.     Earwax Buildup, Adult The ears produce a substance called earwax that helps keep bacteria out of the ear and protects the skin in the ear canal. Occasionally, earwax can build up in the ear and cause discomfort or hearing loss. What increases the risk? This condition is more likely to develop in people who:  Are male.  Are elderly.  Naturally produce more earwax.  Clean their ears often with cotton swabs.  Use earplugs often.  Use in-ear headphones often.  Wear hearing aids.  Have narrow ear canals.  Have earwax that is overly thick or sticky.  Have eczema.  Are dehydrated.  Have excess hair in the ear canal. What are the signs or symptoms? Symptoms of this condition include:  Reduced or muffled hearing.  A feeling of fullness in the ear or feeling that the ear is plugged.  Fluid coming from the ear.  Ear pain.  Ear itch.  Ringing in the ear.  Coughing.  An obvious piece of earwax that can be seen inside the ear canal. How is this diagnosed? This condition may be diagnosed based on:  Your symptoms.  Your medical history.  An ear exam. During the exam, your health care provider will look into your ear with an instrument called an otoscope. You may have tests, including a hearing test. How is this treated? This condition may be treated by:  Using ear drops to soften the earwax.  Having the earwax removed by a health care provider. The health care provider may: ? Flush the ear with water. ? Use an instrument that has a loop on the end (curette). ? Use a suction device.  Surgery to remove the wax buildup. This may be done in severe cases. Follow these instructions at home:   Take over-the-counter and prescription medicines only as told by your health care provider.  Do not put any objects, including cotton swabs, into your ear. You can clean the opening  of your ear canal with a washcloth or facial tissue.  Follow instructions from your health care provider about cleaning your ears. Do not over-clean your ears.  Drink enough fluid to keep your urine clear or pale yellow. This will help to thin the earwax.  Keep all follow-up  visits as told by your health care provider. If earwax builds up in your ears often or if you use hearing aids, consider seeing your health care provider for routine, preventive ear cleanings. Ask your health care provider how often you should schedule your cleanings.  If you have hearing aids, clean them according to instructions from the manufacturer and your health care provider. Contact a health care provider if:  You have ear pain.  You develop a fever.  You have blood, pus, or other fluid coming from your ear.  You have hearing loss.  You have ringing in your ears that does not go away.  Your symptoms do not improve with treatment.  You feel like the room is spinning (vertigo). Summary  Earwax can build up in the ear and cause discomfort or hearing loss.  The most common symptoms of this condition include reduced or muffled hearing and a feeling of fullness in the ear or feeling that the ear is plugged.  This condition may be diagnosed based on your symptoms, your medical history, and an ear exam.  This condition may be treated by using ear drops to soften the earwax or by having the earwax removed by a health care provider.  Do not put any objects, including cotton swabs, into your ear. You can clean the opening of your ear canal with a washcloth or facial tissue. This information is not intended to replace advice given to you by your health care provider. Make sure you discuss any questions you have with your health care provider. Document Released: 07/30/2004 Document Revised: 06/03/2017 Document Reviewed: 09/02/2016 Elsevier Interactive Patient Education  2019 [place] 65  Years and Older, Male Preventive care refers to lifestyle choices and visits with your health care provider that can promote health and wellness. What does preventive care include?   A yearly physical exam. This is also called an annual well check.  Dental exams once or twice a year.  Routine eye exams. Ask your health care provider how often you should have your eyes checked.  Personal lifestyle choices, including: ? Daily care of your teeth and gums. ? Regular physical activity. ? Eating a healthy diet. ? Avoiding tobacco and drug use. ? Limiting alcohol use. ? Practicing safe sex. ? Taking low doses of aspirin every day. ? Taking vitamin and mineral supplements as recommended by your health care provider. What happens during an annual well check? The services and screenings done by your health care provider during your annual well check will depend on your age, overall health, lifestyle risk factors, and family history of disease. Counseling Your health care provider may ask you questions about your:  Alcohol use.  Tobacco use.  Drug use.  Emotional well-being.  Home and relationship well-being.  Sexual activity.  Eating habits.  History of falls.  Memory and ability to understand (cognition).  Work and work Statistician. Screening You may have the following tests or measurements:  Height, weight, and BMI.  Blood pressure.  Lipid and cholesterol levels. These may be checked every 5 years, or more frequently if you are over 79 years old.  Skin check.  Lung cancer screening. You may have this screening every year starting at age 71 if you have a 30-pack-year history of smoking and currently smoke or have quit within the past 15 years.  Colorectal cancer screening. All adults should have this screening starting at age 64 and continuing until age 11. You will have tests  every 1-10 years, depending on your results and the type of screening test. People at  increased risk should start screening at an earlier age. Screening tests may include: ? Guaiac-based fecal occult blood testing. ? Fecal immunochemical test (FIT). ? Stool DNA test. ? Virtual colonoscopy. ? Sigmoidoscopy. During this test, a flexible tube with a tiny camera (sigmoidoscope) is used to examine your rectum and lower colon. The sigmoidoscope is inserted through your anus into your rectum and lower colon. ? Colonoscopy. During this test, a long, thin, flexible tube with a tiny camera (colonoscope) is used to examine your entire colon and rectum.  Prostate cancer screening. Recommendations will vary depending on your family history and other risks.  Hepatitis C blood test.  Hepatitis B blood test.  Sexually transmitted disease (STD) testing.  Diabetes screening. This is done by checking your blood sugar (glucose) after you have not eaten for a while (fasting). You may have this done every 1-3 years.  Abdominal aortic aneurysm (AAA) screening. You may need this if you are a current or former smoker.  Osteoporosis. You may be screened starting at age 44 if you are at high risk. Talk with your health care provider about your test results, treatment options, and if necessary, the need for more tests. Vaccines Your health care provider may recommend certain vaccines, such as:  Influenza vaccine. This is recommended every year.  Tetanus, diphtheria, and acellular pertussis (Tdap, Td) vaccine. You may need a Td booster every 10 years.  Varicella vaccine. You may need this if you have not been vaccinated.  Zoster vaccine. You may need this after age 15.  Measles, mumps, and rubella (MMR) vaccine. You may need at least one dose of MMR if you were born in 1957 or later. You may also need a second dose.  Pneumococcal 13-valent conjugate (PCV13) vaccine. One dose is recommended after age 72.  Pneumococcal polysaccharide (PPSV23) vaccine. One dose is recommended after age  58.  Meningococcal vaccine. You may need this if you have certain conditions.  Hepatitis A vaccine. You may need this if you have certain conditions or if you travel or work in places where you may be exposed to hepatitis A.  Hepatitis B vaccine. You may need this if you have certain conditions or if you travel or work in places where you may be exposed to hepatitis B.  Haemophilus influenzae type b (Hib) vaccine. You may need this if you have certain risk factors. Talk to your health care provider about which screenings and vaccines you need and how often you need them. This information is not intended to replace advice given to you by your health care provider. Make sure you discuss any questions you have with your health care provider. Document Released: 07/19/2015 Document Revised: 08/12/2017 Document Reviewed: 04/23/2015 Elsevier Interactive Patient Education  [employer].   If you have lab work done today you will be contacted with your lab results within the next 2 weeks.  If you have not heard from [place] then please contact us. The fastest way to get your results is to register for My Chart.   IF you received an x-ray today, you will receive an invoice from [facility]. Please contact [facility] at [phone] with questions or concerns regarding your invoice.   IF you received labwork today, you will receive an invoice from [facility]. Please contact [facility] at [phone] with questions or concerns regarding your invoice.   Our billing staff will not be able  to assist you with questions regarding bills from these companies.  You will be contacted with the lab results as soon as they are available. The fastest way to get your results is to activate your My Chart account. Instructions are located on the last page of this paperwork. If you have not heard from [place] regarding the results in 2 weeks, please contact this office.        Signed,   [name], MD [facility] [place].  08/03/18 6:37 PM

## 2018-08-03 NOTE — Patient Instructions (Addendum)
Use pill minder for blood pressure meds as well.  Will stop toprol for now as not taking it, but Keep a record of your blood pressures outside of the office and return if over 140/90.   Call and schedule eye doctor appointment.   Recommend Prevnar vaccine followed by Pneumovax in the next 6 months.  Let me know if you would like to have this performed here  Cerumen impaction improved after lavage in office today.  If any increasing pain in your ears or discharge please return for recheck.  Flu vaccine given today.   Elavil changed to '50mg'$  - take 1 pill per night.   I do not have record of shingles vaccine, please check with your rheumatologist as to which 1 was given.  If you have had both Shingrix and then you are good.  If you have not had Shingrix I would recommend that type of shingles vaccine.     Earwax Buildup, Adult The ears produce a substance called earwax that helps keep bacteria out of the ear and protects the skin in the ear canal. Occasionally, earwax can build up in the ear and cause discomfort or hearing loss. What increases the risk? This condition is more likely to develop in people who:  Are male.  Are elderly.  Naturally produce more earwax.  Clean their ears often with cotton swabs.  Use earplugs often.  Use in-ear headphones often.  Wear hearing aids.  Have narrow ear canals.  Have earwax that is overly thick or sticky.  Have eczema.  Are dehydrated.  Have excess hair in the ear canal. What are the signs or symptoms? Symptoms of this condition include:  Reduced or muffled hearing.  A feeling of fullness in the ear or feeling that the ear is plugged.  Fluid coming from the ear.  Ear pain.  Ear itch.  Ringing in the ear.  Coughing.  An obvious piece of earwax that can be seen inside the ear canal. How is this diagnosed? This condition may be diagnosed based on:  Your symptoms.  Your medical history.  An ear exam. During the  exam, your health care provider will look into your ear with an instrument called an otoscope. You may have tests, including a hearing test. How is this treated? This condition may be treated by:  Using ear drops to soften the earwax.  Having the earwax removed by a health care provider. The health care provider may: ? Flush the ear with water. ? Use an instrument that has a loop on the end (curette). ? Use a suction device.  Surgery to remove the wax buildup. This may be done in severe cases. Follow these instructions at home:   Take over-the-counter and prescription medicines only as told by your health care provider.  Do not put any objects, including cotton swabs, into your ear. You can clean the opening of your ear canal with a washcloth or facial tissue.  Follow instructions from your health care provider about cleaning your ears. Do not over-clean your ears.  Drink enough fluid to keep your urine clear or pale yellow. This will help to thin the earwax.  Keep all follow-up visits as told by your health care provider. If earwax builds up in your ears often or if you use hearing aids, consider seeing your health care provider for routine, preventive ear cleanings. Ask your health care provider how often you should schedule your cleanings.  If you have hearing aids, clean them according  to instructions from the manufacturer and your health care provider. Contact a health care provider if:  You have ear pain.  You develop a fever.  You have blood, pus, or other fluid coming from your ear.  You have hearing loss.  You have ringing in your ears that does not go away.  Your symptoms do not improve with treatment.  You feel like the room is spinning (vertigo). Summary  Earwax can build up in the ear and cause discomfort or hearing loss.  The most common symptoms of this condition include reduced or muffled hearing and a feeling of fullness in the ear or feeling that the  ear is plugged.  This condition may be diagnosed based on your symptoms, your medical history, and an ear exam.  This condition may be treated by using ear drops to soften the earwax or by having the earwax removed by a health care provider.  Do not put any objects, including cotton swabs, into your ear. You can clean the opening of your ear canal with a washcloth or facial tissue. This information is not intended to replace advice given to you by your health care provider. Make sure you discuss any questions you have with your health care provider. Document Released: 07/30/2004 Document Revised: 06/03/2017 Document Reviewed: 09/02/2016 Elsevier Interactive Patient Education  2019 [place] 65 Years and Older, Male Preventive care refers to lifestyle choices and visits with your health care provider that can promote health and wellness. What does preventive care include?   A yearly physical exam. This is also called an annual well check.  Dental exams once or twice a year.  Routine eye exams. Ask your health care provider how often you should have your eyes checked.  Personal lifestyle choices, including: ? Daily care of your teeth and gums. ? Regular physical activity. ? Eating a healthy diet. ? Avoiding tobacco and drug use. ? Limiting alcohol use. ? Practicing safe sex. ? Taking low doses of aspirin every day. ? Taking vitamin and mineral supplements as recommended by your health care provider. What happens during an annual well check? The services and screenings done by your health care provider during your annual well check will depend on your age, overall health, lifestyle risk factors, and family history of disease. Counseling Your health care provider may ask you questions about your:  Alcohol use.  Tobacco use.  Drug use.  Emotional well-being.  Home and relationship well-being.  Sexual activity.  Eating habits.  History of  falls.  Memory and ability to understand (cognition).  Work and work Statistician. Screening You may have the following tests or measurements:  Height, weight, and BMI.  Blood pressure.  Lipid and cholesterol levels. These may be checked every 5 years, or more frequently if you are over 83 years old.  Skin check.  Lung cancer screening. You may have this screening every year starting at age 51 if you have a 30-pack-year history of smoking and currently smoke or have quit within the past 15 years.  Colorectal cancer screening. All adults should have this screening starting at age 78 and continuing until age 65. You will have tests every 1-10 years, depending on your results and the type of screening test. People at increased risk should start screening at an earlier age. Screening tests may include: ? Guaiac-based fecal occult blood testing. ? Fecal immunochemical test (FIT). ? Stool DNA test. ? Virtual colonoscopy. ? Sigmoidoscopy. During this test, a flexible  tube with a tiny camera (sigmoidoscope) is used to examine your rectum and lower colon. The sigmoidoscope is inserted through your anus into your rectum and lower colon. ? Colonoscopy. During this test, a long, thin, flexible tube with a tiny camera (colonoscope) is used to examine your entire colon and rectum.  Prostate cancer screening. Recommendations will vary depending on your family history and other risks.  Hepatitis C blood test.  Hepatitis B blood test.  Sexually transmitted disease (STD) testing.  Diabetes screening. This is done by checking your blood sugar (glucose) after you have not eaten for a while (fasting). You may have this done every 1-3 years.  Abdominal aortic aneurysm (AAA) screening. You may need this if you are a current or former smoker.  Osteoporosis. You may be screened starting at age 24 if you are at high risk. Talk with your health care provider about your test results, treatment options, and  if necessary, the need for more tests. Vaccines Your health care provider may recommend certain vaccines, such as:  Influenza vaccine. This is recommended every year.  Tetanus, diphtheria, and acellular pertussis (Tdap, Td) vaccine. You may need a Td booster every 10 years.  Varicella vaccine. You may need this if you have not been vaccinated.  Zoster vaccine. You may need this after age 54.  Measles, mumps, and rubella (MMR) vaccine. You may need at least one dose of MMR if you were born in 1957 or later. You may also need a second dose.  Pneumococcal 13-valent conjugate (PCV13) vaccine. One dose is recommended after age 40.  Pneumococcal polysaccharide (PPSV23) vaccine. One dose is recommended after age 26.  Meningococcal vaccine. You may need this if you have certain conditions.  Hepatitis A vaccine. You may need this if you have certain conditions or if you travel or work in places where you may be exposed to hepatitis A.  Hepatitis B vaccine. You may need this if you have certain conditions or if you travel or work in places where you may be exposed to hepatitis B.  Haemophilus influenzae type b (Hib) vaccine. You may need this if you have certain risk factors. Talk to your health care provider about which screenings and vaccines you need and how often you need them. This information is not intended to replace advice given to you by your health care provider. Make sure you discuss any questions you have with your health care provider. Document Released: 07/19/2015 Document Revised: 08/12/2017 Document Reviewed: 04/23/2015 Elsevier Interactive Patient Education  [employer].   If you have lab work done today you will be contacted with your lab results within the next 2 weeks.  If you have not heard from [place] then please contact us. The fastest way to get your results is to register for My Chart.   IF you received an x-ray today, you will receive an invoice from [facility]  Radiology. Please contact [facility] at [phone] with questions or concerns regarding your invoice.   IF you received labwork today, you will receive an invoice from [facility]. Please contact [facility] at [phone] with questions or concerns regarding your invoice.   Our billing staff will not be able to assist you with questions regarding bills from these companies.  You will be contacted with the lab results as soon as they are available. The fastest way to get your results is to activate your My Chart account. Instructions are located on the last page of this paperwork. If you have not  heard from [place] regarding the results in 2 weeks, please contact this office.

## 2018-08-17 DIAGNOSIS — Z6823 Body mass index (BMI) 23.0-23.9, adult: Secondary | ICD-10-CM | POA: Diagnosis not present

## 2018-08-17 DIAGNOSIS — Z79891 Long term (current) use of opiate analgesic: Secondary | ICD-10-CM | POA: Diagnosis not present

## 2018-08-17 DIAGNOSIS — M7918 Myalgia, other site: Secondary | ICD-10-CM | POA: Diagnosis not present

## 2018-08-17 DIAGNOSIS — Z79899 Other long term (current) drug therapy: Secondary | ICD-10-CM | POA: Diagnosis not present

## 2018-09-14 DIAGNOSIS — Z6823 Body mass index (BMI) 23.0-23.9, adult: Secondary | ICD-10-CM | POA: Diagnosis not present

## 2018-09-14 DIAGNOSIS — Z79899 Other long term (current) drug therapy: Secondary | ICD-10-CM | POA: Diagnosis not present

## 2018-09-14 DIAGNOSIS — M7918 Myalgia, other site: Secondary | ICD-10-CM | POA: Diagnosis not present

## 2018-09-14 DIAGNOSIS — L4050 Arthropathic psoriasis, unspecified: Secondary | ICD-10-CM | POA: Diagnosis not present

## 2018-10-08 DIAGNOSIS — S4991XA Unspecified injury of right shoulder and upper arm, initial encounter: Secondary | ICD-10-CM | POA: Diagnosis not present

## 2018-10-08 DIAGNOSIS — M25511 Pain in right shoulder: Secondary | ICD-10-CM | POA: Diagnosis not present

## 2018-10-10 DIAGNOSIS — M545 Low back pain: Secondary | ICD-10-CM | POA: Diagnosis not present

## 2018-10-10 DIAGNOSIS — S43101A Unspecified dislocation of right acromioclavicular joint, initial encounter: Secondary | ICD-10-CM | POA: Diagnosis not present

## 2018-10-10 DIAGNOSIS — L4050 Arthropathic psoriasis, unspecified: Secondary | ICD-10-CM | POA: Diagnosis not present

## 2018-10-10 DIAGNOSIS — S43004S Unspecified dislocation of right shoulder joint, sequela: Secondary | ICD-10-CM | POA: Diagnosis not present

## 2018-10-10 DIAGNOSIS — G8929 Other chronic pain: Secondary | ICD-10-CM | POA: Diagnosis not present

## 2018-10-10 DIAGNOSIS — Z79899 Other long term (current) drug therapy: Secondary | ICD-10-CM | POA: Diagnosis not present

## 2018-10-24 DIAGNOSIS — S43101D Unspecified dislocation of right acromioclavicular joint, subsequent encounter: Secondary | ICD-10-CM | POA: Diagnosis not present

## 2018-11-04 DEATH — deceased

## 2018-11-09 ENCOUNTER — Telehealth: Payer: Self-pay | Admitting: Family Medicine

## 2018-11-09 NOTE — Telephone Encounter (Signed)
Death certificates was given to you yesterday and you stated you were going call to f/u. Just letting you know I just seen this msg

## 2018-11-09 NOTE — Telephone Encounter (Signed)
Death certificate received yesterday.  I was not aware of his passing.  I did speak to the medical examiner Lanny Hurst who  evaluated patient at home after initial call form [employer].  Time of death was 47 on Nov 25, 2022 when evaluated by paramedics.  Patient was found sitting on the bathroom floor.  Last contact had been approximately 3 days previously.  [employer] did notice some disarray in his bedroom, including lamps off dresser but there was no missing money or other signs of struggle, no signs of foul play apparent to medical examiner on evaluation. There was no pill residue, no notes left.  Empty alcohol bottle in trash, but none in bathroom.  He did have some bruising on his arms but no signs of new wounds and appeared to be a natural death, possible ASCVD.  Medical examiner declined further evaluation given likely natural death.  I will complete certificate and plan to call his daughter to express condolences as well today.

## 2018-11-09 NOTE — Telephone Encounter (Signed)
Copied from [place] [id number]. Topic: Quick Communication - See Telephone Encounter >> Nov 09, 2018  9:31 AM Valla Leaver wrote: CRM for notification. See Telephone encounter for: 11/09/18. Ivey with [facility] in [place] calling to check on death certificate dropped of yesterday for Dr. Nyoka Cowden to sign [phone]

## 2018-11-10 NOTE — Telephone Encounter (Signed)
I did need to speak to medical examiner again last night for clarification on a few items. Death certificate completed yesterday evening and placed on top of the fax folder in provider area to be faxed this am (as all staff were gone last night once completed) and available for pickup this morning.  12:07 PM Spoke with Wilfred Curtis - she will be handling this and discussing with family/nursing home about delay. I am available if further questions.

## 2018-11-10 NOTE — Telephone Encounter (Signed)
Received call from pt's brother, Dr. Nicola Girt for f/u on death certificate.  States they have called multiple times and lack of certificate is delaying them.  Was told it would be faxed yesterday.  Advised I will look into situation and f/u on certificate.  Needs to be faxed to [phone].

## 2018-11-10 NOTE — Telephone Encounter (Signed)
Karlene Einstein calling again to check on status. Requesting it be faxed and someone will be by to pick up original.

## 2018-11-10 NOTE — Telephone Encounter (Signed)
Ivy with [facility] called back and said the patient's family is irate. She said she spoke with Dr Carlota Raspberry herself yesterday and he advised her that he was signing the death certificate then. She is calling back now because she wants it faxed to her @[phone]. The family told her that if it was not signed and sent back to [facility] by 12pm today they would be calling the office. Please Advise.

## 2018-11-10 NOTE — Telephone Encounter (Signed)
Dr. Carlota Raspberry funeral home is calling concerning death cert. Let one of [place] know when ready

## 2018-11-10 NOTE — Telephone Encounter (Signed)
Completed death certificate faxed to [facility] at [phone].  Confirmation received.  Also TC to funeral home who confirmed it was received and nothing else needed from PCP at this time.  Call to brother, advised that death certicate was received by funeral home.  Apologized for delay and explained that additional information was needed from [place].  Brother denies further needs at this time. Certificate given to front desk for pick up.

## 2018-11-10 NOTE — Telephone Encounter (Signed)
Please notify when ready.

## 2018-11-10 NOTE — Telephone Encounter (Signed)
Funeral home would like death cert faxed to [phone]

## 2018-11-28 ENCOUNTER — Encounter: Payer: Self-pay | Admitting: Gastroenterology
